# Patient Record
Sex: Female | Born: 1975 | Hispanic: Yes | Marital: Single | Smoking: Never smoker
Health system: Southern US, Community
[De-identification: ages and names within clinical notes are randomized; demographics above are authoritative.]

---

## 1999-12-14 ENCOUNTER — Encounter: Admission: RE | Admit: 1999-12-14 | Discharge: 1999-12-14 | Payer: Self-pay | Admitting: Family Medicine

## 1999-12-14 ENCOUNTER — Encounter: Payer: Self-pay | Admitting: Family Medicine

## 2015-02-27 MED FILL — LARIN FE 1-20 TABLET: 1-20 | 84 days supply | Qty: 84 | Fill #4

## 2015-05-28 MED FILL — LARIN FE 1-20 TABLET: 1-20 | 84 days supply | Qty: 84 | Fill #0

## 2015-07-30 DIAGNOSIS — Z1389 Encounter for screening for other disorder: Secondary | ICD-10-CM | POA: Diagnosis not present

## 2015-07-30 DIAGNOSIS — Z131 Encounter for screening for diabetes mellitus: Secondary | ICD-10-CM | POA: Diagnosis not present

## 2015-07-30 DIAGNOSIS — Z124 Encounter for screening for malignant neoplasm of cervix: Secondary | ICD-10-CM | POA: Diagnosis not present

## 2015-07-30 DIAGNOSIS — Z1329 Encounter for screening for other suspected endocrine disorder: Secondary | ICD-10-CM | POA: Diagnosis not present

## 2015-07-30 DIAGNOSIS — Z113 Encounter for screening for infections with a predominantly sexual mode of transmission: Secondary | ICD-10-CM | POA: Diagnosis not present

## 2015-07-30 DIAGNOSIS — Z6823 Body mass index (BMI) 23.0-23.9, adult: Secondary | ICD-10-CM | POA: Diagnosis not present

## 2015-07-30 DIAGNOSIS — Z01419 Encounter for gynecological examination (general) (routine) without abnormal findings: Secondary | ICD-10-CM | POA: Diagnosis not present

## 2015-07-30 DIAGNOSIS — Z13220 Encounter for screening for lipoid disorders: Secondary | ICD-10-CM | POA: Diagnosis not present

## 2015-09-02 MED FILL — NORETHIN-ESTRAD-FERR 1-0.02: 1-20 | 84 days supply | Qty: 84 | Fill #1

## 2015-12-01 MED FILL — NORETHIN-ESTRAD-FERR 1-0.02: 1-20 | 84 days supply | Qty: 84 | Fill #2

## 2016-02-24 MED FILL — NORETHIN-ESTRAD-FERR 1-0.02: 1-20 | 84 days supply | Qty: 84 | Fill #3

## 2016-05-18 MED FILL — NORETHIN-ESTRAD-FERR 1-0.02: 1-20 | 84 days supply | Qty: 84 | Fill #4

## 2016-08-12 MED FILL — NORETHIN-ESTRAD-FERR 1-0.02: 1-20 | 28 days supply | Qty: 28 | Fill #0

## 2016-09-06 DIAGNOSIS — Z130 Encounter for screening for diseases of the blood and blood-forming organs and certain disorders involving the immune mechanism: Secondary | ICD-10-CM | POA: Diagnosis not present

## 2016-09-06 DIAGNOSIS — Z01419 Encounter for gynecological examination (general) (routine) without abnormal findings: Secondary | ICD-10-CM | POA: Diagnosis not present

## 2016-09-06 DIAGNOSIS — Z6824 Body mass index (BMI) 24.0-24.9, adult: Secondary | ICD-10-CM | POA: Diagnosis not present

## 2016-09-06 DIAGNOSIS — Z1329 Encounter for screening for other suspected endocrine disorder: Secondary | ICD-10-CM | POA: Diagnosis not present

## 2016-09-06 DIAGNOSIS — Z13220 Encounter for screening for lipoid disorders: Secondary | ICD-10-CM | POA: Diagnosis not present

## 2016-09-06 DIAGNOSIS — Z124 Encounter for screening for malignant neoplasm of cervix: Secondary | ICD-10-CM | POA: Diagnosis not present

## 2016-09-06 MED FILL — NORETHIN-ESTRAD-FERR 1-0.02: 1-20 | 84 days supply | Qty: 84 | Fill #0

## 2016-12-22 MED FILL — NORETHIN-ESTRAD-FERR 1-0.02: 1-20 | 84 days supply | Qty: 84 | Fill #1

## 2017-03-22 MED FILL — LARIN FE 1-20 TABLET: 1-20 | 84 days supply | Qty: 84 | Fill #2

## 2017-06-15 MED FILL — LARIN FE 1-20 TABLET: 1-20 | 84 days supply | Qty: 84 | Fill #3

## 2017-09-15 MED FILL — NORETHIN-ESTRAD-FERR 1-0.02: 1-20 | 28 days supply | Qty: 28 | Fill #0

## 2017-10-07 MED FILL — NORETHIN-ESTRAD-FERR 1-0.02: 1-20 | 28 days supply | Qty: 28 | Fill #1

## 2017-11-03 DIAGNOSIS — Z13220 Encounter for screening for lipoid disorders: Secondary | ICD-10-CM | POA: Diagnosis not present

## 2017-11-03 DIAGNOSIS — Z01419 Encounter for gynecological examination (general) (routine) without abnormal findings: Secondary | ICD-10-CM | POA: Diagnosis not present

## 2017-11-03 DIAGNOSIS — Z124 Encounter for screening for malignant neoplasm of cervix: Secondary | ICD-10-CM | POA: Diagnosis not present

## 2017-11-03 DIAGNOSIS — Z1329 Encounter for screening for other suspected endocrine disorder: Secondary | ICD-10-CM | POA: Diagnosis not present

## 2017-11-03 DIAGNOSIS — Z1389 Encounter for screening for other disorder: Secondary | ICD-10-CM | POA: Diagnosis not present

## 2017-11-03 DIAGNOSIS — Z131 Encounter for screening for diabetes mellitus: Secondary | ICD-10-CM | POA: Diagnosis not present

## 2017-11-03 MED FILL — NORETHIN-ESTRAD-FERR 1-0.02: 1-20 | 84 days supply | Qty: 84 | Fill #0

## 2017-11-11 DIAGNOSIS — Z1231 Encounter for screening mammogram for malignant neoplasm of breast: Secondary | ICD-10-CM | POA: Diagnosis not present

## 2017-11-15 ENCOUNTER — Other Ambulatory Visit: Payer: Self-pay | Admitting: Obstetrics and Gynecology

## 2017-11-15 DIAGNOSIS — R928 Other abnormal and inconclusive findings on diagnostic imaging of breast: Secondary | ICD-10-CM

## 2017-11-18 ENCOUNTER — Ambulatory Visit
Admission: RE | Admit: 2017-11-18 | Discharge: 2017-11-18 | Disposition: A | Discharge status: Home / Self Care | Payer: 59 | Source: Ambulatory Visit | Attending: Obstetrics and Gynecology | Admitting: Obstetrics and Gynecology

## 2017-11-18 ENCOUNTER — Other Ambulatory Visit: Payer: Self-pay | Admitting: Obstetrics and Gynecology

## 2017-11-18 DIAGNOSIS — N6002 Solitary cyst of left breast: Secondary | ICD-10-CM | POA: Diagnosis not present

## 2017-11-18 DIAGNOSIS — R921 Mammographic calcification found on diagnostic imaging of breast: Secondary | ICD-10-CM | POA: Diagnosis not present

## 2017-11-18 DIAGNOSIS — N6320 Unspecified lump in the left breast, unspecified quadrant: Secondary | ICD-10-CM

## 2017-11-18 DIAGNOSIS — R928 Other abnormal and inconclusive findings on diagnostic imaging of breast: Secondary | ICD-10-CM

## 2018-01-30 MED FILL — BLISOVI FE 1/20 1-20 MG-MCG: 1-20 | 84 days supply | Qty: 84 | Fill #1

## 2018-04-26 MED FILL — BLISOVI FE 1/20 1-20 MG-MCG: 1-20 | 84 days supply | Qty: 84 | Fill #2

## 2018-05-26 ENCOUNTER — Other Ambulatory Visit: Payer: 59

## 2018-07-10 ENCOUNTER — Other Ambulatory Visit: Payer: 59

## 2018-07-19 MED FILL — BLISOVI FE 1/20 1-20 MG-MCG: 1-20 | 84 days supply | Qty: 84 | Fill #3

## 2018-07-28 ENCOUNTER — Ambulatory Visit
Admission: RE | Admit: 2018-07-28 | Discharge: 2018-07-28 | Disposition: A | Discharge status: Home / Self Care | Payer: 59 | Source: Ambulatory Visit | Attending: Obstetrics and Gynecology | Admitting: Obstetrics and Gynecology

## 2018-07-28 ENCOUNTER — Other Ambulatory Visit: Payer: Self-pay

## 2018-07-28 ENCOUNTER — Other Ambulatory Visit: Payer: Self-pay | Admitting: Obstetrics and Gynecology

## 2018-07-28 DIAGNOSIS — N6321 Unspecified lump in the left breast, upper outer quadrant: Secondary | ICD-10-CM | POA: Diagnosis not present

## 2018-07-28 DIAGNOSIS — N6320 Unspecified lump in the left breast, unspecified quadrant: Secondary | ICD-10-CM

## 2018-07-28 DIAGNOSIS — N6324 Unspecified lump in the left breast, lower inner quadrant: Secondary | ICD-10-CM | POA: Diagnosis not present

## 2018-07-28 DIAGNOSIS — R922 Inconclusive mammogram: Secondary | ICD-10-CM | POA: Diagnosis not present

## 2018-10-06 MED FILL — BLISOVI FE 1/20 1-20 MG-MCG: 1-20 | 84 days supply | Qty: 84 | Fill #4

## 2018-11-17 ENCOUNTER — Other Ambulatory Visit: Payer: 59

## 2018-11-29 ENCOUNTER — Other Ambulatory Visit: Payer: Self-pay

## 2018-11-29 DIAGNOSIS — Z20822 Contact with and (suspected) exposure to covid-19: Secondary | ICD-10-CM

## 2018-11-29 DIAGNOSIS — Z20828 Contact with and (suspected) exposure to other viral communicable diseases: Secondary | ICD-10-CM | POA: Diagnosis not present

## 2018-12-01 LAB — NOVEL CORONAVIRUS, NAA: SARS-CoV-2, NAA: NOT DETECTED

## 2018-12-26 DIAGNOSIS — Z13220 Encounter for screening for lipoid disorders: Secondary | ICD-10-CM | POA: Diagnosis not present

## 2018-12-26 DIAGNOSIS — Z202 Contact with and (suspected) exposure to infections with a predominantly sexual mode of transmission: Secondary | ICD-10-CM | POA: Diagnosis not present

## 2018-12-26 DIAGNOSIS — Z1389 Encounter for screening for other disorder: Secondary | ICD-10-CM | POA: Diagnosis not present

## 2018-12-26 DIAGNOSIS — Z01419 Encounter for gynecological examination (general) (routine) without abnormal findings: Secondary | ICD-10-CM | POA: Diagnosis not present

## 2018-12-26 DIAGNOSIS — Z1329 Encounter for screening for other suspected endocrine disorder: Secondary | ICD-10-CM | POA: Diagnosis not present

## 2018-12-26 DIAGNOSIS — Z113 Encounter for screening for infections with a predominantly sexual mode of transmission: Secondary | ICD-10-CM | POA: Diagnosis not present

## 2018-12-26 DIAGNOSIS — Z131 Encounter for screening for diabetes mellitus: Secondary | ICD-10-CM | POA: Diagnosis not present

## 2018-12-26 DIAGNOSIS — Z124 Encounter for screening for malignant neoplasm of cervix: Secondary | ICD-10-CM | POA: Diagnosis not present

## 2018-12-26 MED FILL — BLISOVI FE 1/20 1-20 MG-MCG: 1-20 | 84 days supply | Qty: 84 | Fill #0

## 2019-01-02 LAB — HM PAP SMEAR

## 2019-02-02 ENCOUNTER — Other Ambulatory Visit: Payer: 59

## 2019-03-06 ENCOUNTER — Other Ambulatory Visit: Payer: Self-pay

## 2019-03-06 ENCOUNTER — Ambulatory Visit
Admission: RE | Admit: 2019-03-06 | Discharge: 2019-03-06 | Disposition: A | Discharge status: Home / Self Care | Payer: 59 | Source: Ambulatory Visit | Attending: Obstetrics and Gynecology | Admitting: Obstetrics and Gynecology

## 2019-03-06 DIAGNOSIS — R922 Inconclusive mammogram: Secondary | ICD-10-CM | POA: Diagnosis not present

## 2019-03-06 DIAGNOSIS — N6320 Unspecified lump in the left breast, unspecified quadrant: Secondary | ICD-10-CM

## 2019-03-06 DIAGNOSIS — N6324 Unspecified lump in the left breast, lower inner quadrant: Secondary | ICD-10-CM | POA: Diagnosis not present

## 2019-03-06 DIAGNOSIS — N6321 Unspecified lump in the left breast, upper outer quadrant: Secondary | ICD-10-CM | POA: Diagnosis not present

## 2019-03-13 MED FILL — BLISOVI FE 1/20 1-20 MG-MCG: 1-20 | 84 days supply | Qty: 84 | Fill #1

## 2019-03-29 MED FILL — BLISOVI FE 1/20 1-20 MG-MCG: 1-20 | 84 days supply | Qty: 84 | Fill #1

## 2019-06-20 MED FILL — BLISOVI FE 1/20 1-20 MG-MCG: 1-20 | 84 days supply | Qty: 84 | Fill #2

## 2019-07-16 DIAGNOSIS — H0102A Squamous blepharitis right eye, upper and lower eyelids: Secondary | ICD-10-CM | POA: Diagnosis not present

## 2019-07-16 MED FILL — PREDNISOLONE AC 1% EYE DROP: 1 | 17 days supply | Qty: 5 | Fill #0

## 2019-07-18 DIAGNOSIS — M40292 Other kyphosis, cervical region: Secondary | ICD-10-CM | POA: Diagnosis not present

## 2019-07-18 DIAGNOSIS — M9901 Segmental and somatic dysfunction of cervical region: Secondary | ICD-10-CM | POA: Diagnosis not present

## 2019-07-18 DIAGNOSIS — M21751 Unequal limb length (acquired), right femur: Secondary | ICD-10-CM | POA: Diagnosis not present

## 2019-07-18 DIAGNOSIS — M9902 Segmental and somatic dysfunction of thoracic region: Secondary | ICD-10-CM | POA: Diagnosis not present

## 2019-07-19 DIAGNOSIS — M21751 Unequal limb length (acquired), right femur: Secondary | ICD-10-CM | POA: Diagnosis not present

## 2019-07-19 DIAGNOSIS — M9901 Segmental and somatic dysfunction of cervical region: Secondary | ICD-10-CM | POA: Diagnosis not present

## 2019-07-19 DIAGNOSIS — M40292 Other kyphosis, cervical region: Secondary | ICD-10-CM | POA: Diagnosis not present

## 2019-07-19 DIAGNOSIS — M9902 Segmental and somatic dysfunction of thoracic region: Secondary | ICD-10-CM | POA: Diagnosis not present

## 2019-07-24 DIAGNOSIS — M21751 Unequal limb length (acquired), right femur: Secondary | ICD-10-CM | POA: Diagnosis not present

## 2019-07-24 DIAGNOSIS — M40292 Other kyphosis, cervical region: Secondary | ICD-10-CM | POA: Diagnosis not present

## 2019-07-24 DIAGNOSIS — M9901 Segmental and somatic dysfunction of cervical region: Secondary | ICD-10-CM | POA: Diagnosis not present

## 2019-07-24 DIAGNOSIS — M9902 Segmental and somatic dysfunction of thoracic region: Secondary | ICD-10-CM | POA: Diagnosis not present

## 2019-07-27 DIAGNOSIS — M9902 Segmental and somatic dysfunction of thoracic region: Secondary | ICD-10-CM | POA: Diagnosis not present

## 2019-07-27 DIAGNOSIS — M40292 Other kyphosis, cervical region: Secondary | ICD-10-CM | POA: Diagnosis not present

## 2019-07-27 DIAGNOSIS — M9901 Segmental and somatic dysfunction of cervical region: Secondary | ICD-10-CM | POA: Diagnosis not present

## 2019-07-27 DIAGNOSIS — M21751 Unequal limb length (acquired), right femur: Secondary | ICD-10-CM | POA: Diagnosis not present

## 2019-07-31 DIAGNOSIS — M40292 Other kyphosis, cervical region: Secondary | ICD-10-CM | POA: Diagnosis not present

## 2019-07-31 DIAGNOSIS — M21751 Unequal limb length (acquired), right femur: Secondary | ICD-10-CM | POA: Diagnosis not present

## 2019-07-31 DIAGNOSIS — M9901 Segmental and somatic dysfunction of cervical region: Secondary | ICD-10-CM | POA: Diagnosis not present

## 2019-07-31 DIAGNOSIS — M9902 Segmental and somatic dysfunction of thoracic region: Secondary | ICD-10-CM | POA: Diagnosis not present

## 2019-08-02 DIAGNOSIS — M9902 Segmental and somatic dysfunction of thoracic region: Secondary | ICD-10-CM | POA: Diagnosis not present

## 2019-08-02 DIAGNOSIS — M9901 Segmental and somatic dysfunction of cervical region: Secondary | ICD-10-CM | POA: Diagnosis not present

## 2019-08-02 DIAGNOSIS — M40292 Other kyphosis, cervical region: Secondary | ICD-10-CM | POA: Diagnosis not present

## 2019-08-02 DIAGNOSIS — M21751 Unequal limb length (acquired), right femur: Secondary | ICD-10-CM | POA: Diagnosis not present

## 2019-08-07 DIAGNOSIS — M40292 Other kyphosis, cervical region: Secondary | ICD-10-CM | POA: Diagnosis not present

## 2019-08-07 DIAGNOSIS — M9902 Segmental and somatic dysfunction of thoracic region: Secondary | ICD-10-CM | POA: Diagnosis not present

## 2019-08-07 DIAGNOSIS — M9901 Segmental and somatic dysfunction of cervical region: Secondary | ICD-10-CM | POA: Diagnosis not present

## 2019-08-07 DIAGNOSIS — M21751 Unequal limb length (acquired), right femur: Secondary | ICD-10-CM | POA: Diagnosis not present

## 2019-08-09 DIAGNOSIS — Z113 Encounter for screening for infections with a predominantly sexual mode of transmission: Secondary | ICD-10-CM | POA: Diagnosis not present

## 2019-08-09 DIAGNOSIS — B373 Candidiasis of vulva and vagina: Secondary | ICD-10-CM | POA: Diagnosis not present

## 2019-08-09 DIAGNOSIS — L292 Pruritus vulvae: Secondary | ICD-10-CM | POA: Diagnosis not present

## 2019-08-09 MED FILL — NYSTATIN-TRIAMCINOLONE OINT: 100000-0.1 | 7 days supply | Qty: 15 | Fill #0

## 2019-08-09 MED FILL — FLUCONAZOLE 150 MG TABS: 150 | 3 days supply | Qty: 2 | Fill #0

## 2019-08-10 DIAGNOSIS — M40292 Other kyphosis, cervical region: Secondary | ICD-10-CM | POA: Diagnosis not present

## 2019-08-10 DIAGNOSIS — M21751 Unequal limb length (acquired), right femur: Secondary | ICD-10-CM | POA: Diagnosis not present

## 2019-08-10 DIAGNOSIS — M9902 Segmental and somatic dysfunction of thoracic region: Secondary | ICD-10-CM | POA: Diagnosis not present

## 2019-08-10 DIAGNOSIS — M9901 Segmental and somatic dysfunction of cervical region: Secondary | ICD-10-CM | POA: Diagnosis not present

## 2019-08-14 DIAGNOSIS — M21751 Unequal limb length (acquired), right femur: Secondary | ICD-10-CM | POA: Diagnosis not present

## 2019-08-14 DIAGNOSIS — M9902 Segmental and somatic dysfunction of thoracic region: Secondary | ICD-10-CM | POA: Diagnosis not present

## 2019-08-14 DIAGNOSIS — M9901 Segmental and somatic dysfunction of cervical region: Secondary | ICD-10-CM | POA: Diagnosis not present

## 2019-08-14 DIAGNOSIS — M40292 Other kyphosis, cervical region: Secondary | ICD-10-CM | POA: Diagnosis not present

## 2019-08-30 ENCOUNTER — Encounter: Payer: Self-pay | Admitting: Pediatrics

## 2019-08-31 ENCOUNTER — Other Ambulatory Visit: Payer: Self-pay | Admitting: Pediatrics

## 2019-08-31 MED ORDER — CIPROFLOXACIN-DEXAMETHASONE 0.3-0.1 % OT SUSP: 4.0000 [drp] | Freq: Two times a day (BID) | OTIC | 0 refills | Status: AC

## 2019-08-31 MED FILL — BLISOVI FE 1/20 1-20 MG-MCG: 1-20 | 84 days supply | Qty: 84 | Fill #3

## 2019-08-31 MED FILL — CIPROFLOXACIN-DEXAMETHASONE: 0.3-0.1 | 7 days supply | Qty: 8 | Fill #0

## 2019-10-09 DIAGNOSIS — Z20828 Contact with and (suspected) exposure to other viral communicable diseases: Secondary | ICD-10-CM | POA: Diagnosis not present

## 2019-11-29 MED FILL — BLISOVI FE 1/20 1-20 MG-MCG: 1-20 | 84 days supply | Qty: 84 | Fill #4

## 2020-01-11 DIAGNOSIS — Z130 Encounter for screening for diseases of the blood and blood-forming organs and certain disorders involving the immune mechanism: Secondary | ICD-10-CM | POA: Diagnosis not present

## 2020-01-11 DIAGNOSIS — Z113 Encounter for screening for infections with a predominantly sexual mode of transmission: Secondary | ICD-10-CM | POA: Diagnosis not present

## 2020-01-11 DIAGNOSIS — Z3202 Encounter for pregnancy test, result negative: Secondary | ICD-10-CM | POA: Diagnosis not present

## 2020-01-11 DIAGNOSIS — Z01419 Encounter for gynecological examination (general) (routine) without abnormal findings: Secondary | ICD-10-CM | POA: Diagnosis not present

## 2020-01-11 DIAGNOSIS — Z13220 Encounter for screening for lipoid disorders: Secondary | ICD-10-CM | POA: Diagnosis not present

## 2020-01-11 DIAGNOSIS — Z1329 Encounter for screening for other suspected endocrine disorder: Secondary | ICD-10-CM | POA: Diagnosis not present

## 2020-01-11 LAB — BASIC METABOLIC PANEL
BUN: 14 (ref 4–21)
CO2: 24 — AB (ref 13–22)
Chloride: 98 — AB (ref 99–108)
Creatinine: 1 (ref 0.5–1.1)
Glucose: 84
Potassium: 3.8 (ref 3.4–5.3)
Sodium: 140 (ref 137–147)

## 2020-01-11 LAB — HM HEPATITIS C SCREENING LAB: HM Hepatitis Screen: NEGATIVE

## 2020-01-11 LAB — COMPREHENSIVE METABOLIC PANEL
Albumin: 5 (ref 3.5–5.0)
Calcium: 9.8 (ref 8.7–10.7)
GFR calc Af Amer: 85
GFR calc non Af Amer: 74
Globulin: 2.4

## 2020-01-11 LAB — RESULTS CONSOLE HPV: CHL HPV: NEGATIVE

## 2020-01-11 LAB — CBC AND DIFFERENTIAL
HCT: 38 (ref 36–46)
Hemoglobin: 13.4 (ref 12.0–16.0)
Platelets: 329 (ref 150–399)
WBC: 6.1

## 2020-01-11 LAB — LIPID PANEL
Cholesterol: 191 (ref 0–200)
HDL: 88 — AB (ref 35–70)
LDL Cholesterol: 89
LDl/HDL Ratio: 14
Triglycerides: 76 (ref 40–160)

## 2020-01-11 LAB — TSH: TSH: 2.22 (ref 0.41–5.90)

## 2020-01-11 LAB — CBC: RBC: 4.14 (ref 3.87–5.11)

## 2020-01-11 LAB — HEMOGLOBIN A1C: Hemoglobin A1C: 5.1

## 2020-01-11 LAB — HM HIV SCREENING LAB: HM HIV Screening: NEGATIVE

## 2020-01-11 LAB — HEPATIC FUNCTION PANEL
ALT: 27 (ref 7–35)
AST: 19 (ref 13–35)
Bilirubin, Total: 0.4

## 2020-01-11 LAB — OB RESULTS CONSOLE GC/CHLAMYDIA: Chlamydia: NEGATIVE

## 2020-01-11 LAB — HIV ANTIBODY (ROUTINE TESTING W REFLEX): HIV 1&2 Ab, 4th Generation: NEGATIVE

## 2020-01-11 LAB — HEPATITIS B SURFACE ANTIGEN: Hepatitis B Surface Ag: NEGATIVE

## 2020-01-11 LAB — MICROALBUMIN, URINE: Microalb, Ur: NEGATIVE

## 2020-01-12 LAB — VITAMIN D 25 HYDROXY (VIT D DEFICIENCY, FRACTURES): Vit D, 25-Hydroxy: 137

## 2020-02-01 DIAGNOSIS — Z20822 Contact with and (suspected) exposure to covid-19: Secondary | ICD-10-CM | POA: Diagnosis not present

## 2020-02-27 ENCOUNTER — Other Ambulatory Visit: Payer: 59

## 2020-02-27 DIAGNOSIS — Z20822 Contact with and (suspected) exposure to covid-19: Secondary | ICD-10-CM

## 2020-02-27 MED FILL — BLISOVI FE 1/20 1-20 MG-MCG: 1-20 | 84 days supply | Qty: 84 | Fill #0

## 2020-03-01 LAB — NOVEL CORONAVIRUS, NAA: SARS-CoV-2, NAA: NOT DETECTED

## 2020-04-29 ENCOUNTER — Other Ambulatory Visit (HOSPITAL_BASED_OUTPATIENT_CLINIC_OR_DEPARTMENT_OTHER): Payer: Self-pay

## 2020-04-29 MED ORDER — NORETHIN ACE-ETH ESTRAD-FE 1-20 MG-MCG PO TABS
1.0000 | ORAL_TABLET | Freq: Every day | ORAL | 3 refills | Status: DC
  Filled 2020-05-26: qty 84, 84d supply, fill #0
  Filled 2020-08-18: qty 84, 84d supply, fill #1
  Filled 2020-11-11: qty 84, 84d supply, fill #2

## 2020-05-26 ENCOUNTER — Other Ambulatory Visit (HOSPITAL_BASED_OUTPATIENT_CLINIC_OR_DEPARTMENT_OTHER): Payer: Self-pay

## 2020-06-10 ENCOUNTER — Other Ambulatory Visit: Payer: Self-pay | Admitting: Obstetrics and Gynecology

## 2020-06-10 DIAGNOSIS — R928 Other abnormal and inconclusive findings on diagnostic imaging of breast: Secondary | ICD-10-CM

## 2020-06-10 DIAGNOSIS — Z1231 Encounter for screening mammogram for malignant neoplasm of breast: Secondary | ICD-10-CM

## 2020-07-17 ENCOUNTER — Other Ambulatory Visit: Payer: Self-pay | Admitting: Obstetrics and Gynecology

## 2020-07-17 DIAGNOSIS — N6320 Unspecified lump in the left breast, unspecified quadrant: Secondary | ICD-10-CM

## 2020-07-17 DIAGNOSIS — Z1231 Encounter for screening mammogram for malignant neoplasm of breast: Secondary | ICD-10-CM

## 2020-07-18 ENCOUNTER — Other Ambulatory Visit: Payer: 59

## 2020-07-24 LAB — HM MAMMOGRAPHY

## 2020-08-12 ENCOUNTER — Other Ambulatory Visit: Payer: Self-pay | Admitting: Obstetrics and Gynecology

## 2020-08-12 DIAGNOSIS — N6320 Unspecified lump in the left breast, unspecified quadrant: Secondary | ICD-10-CM

## 2020-08-12 DIAGNOSIS — N63 Unspecified lump in unspecified breast: Secondary | ICD-10-CM

## 2020-08-14 ENCOUNTER — Encounter (HOSPITAL_BASED_OUTPATIENT_CLINIC_OR_DEPARTMENT_OTHER): Payer: Self-pay | Admitting: Nurse Practitioner

## 2020-08-14 ENCOUNTER — Ambulatory Visit (HOSPITAL_BASED_OUTPATIENT_CLINIC_OR_DEPARTMENT_OTHER): Payer: 59 | Admitting: Nurse Practitioner

## 2020-08-14 ENCOUNTER — Other Ambulatory Visit: Payer: Self-pay

## 2020-08-14 VITALS — BP 117/79 | HR 75 | Ht 64.0 in | Wt 146.2 lb

## 2020-08-14 DIAGNOSIS — Z7689 Persons encountering health services in other specified circumstances: Secondary | ICD-10-CM | POA: Insufficient documentation

## 2020-08-14 DIAGNOSIS — Z0000 Encounter for general adult medical examination without abnormal findings: Secondary | ICD-10-CM | POA: Insufficient documentation

## 2020-08-14 NOTE — Patient Instructions (Addendum)
Recommendations from today's visit: Let us know if you need anything.  We will see you in November unless you need [place] sooner!  Information on diet, exercise, and health maintenance recommendations are listed below. This is information to help you be sure you are on track for optimal health and monitoring.   Please look over this and let us know if you have any questions or if you have completed any of the health maintenance outside of [place] so that we can be sure your records are up to date.  ___________________________________________________________  Thank you for choosing [place] at [facility] for your Primary Care needs. I am excited for the opportunity to partner with you to meet your health care goals. It was a pleasure meeting you today!  I am an Adult-Geriatric Nurse Practitioner with a background in caring for patients for more than 20 years. I received my Paediatric nurse in Nursing and my Doctor of Nursing Practice degrees at [employer]. I received additional fellowship training in primary care and sports medicine after receiving my doctorate degree. I provide primary care and sports medicine services to patients age 47 and older within this office. I am also a provider with the [facility] and the director of the APP Fellowship with [facility].  I am a [place] native, but have called the [place] area home for nearly 20 years and am proud to be a member of this community.   I am passionate about providing the best service to you through preventive medicine and supportive care. I consider you a part of the medical team and value your input. I work diligently to ensure that you are heard and your needs are met in a safe and effective manner. I want you to feel comfortable with me as your provider and want you to know that your health concerns are important to me.   For your information, our office hours are  Monday- Friday 8:00 AM - 5:00 PM At this time I am not in the office on Wednesdays.  If you have questions or concerns, please call our office at [phone] or send [place] a MyChart message and we will respond as quickly as possible.   For all urgent or time sensitive needs we ask that you please call the office to avoid delays. MyChart is not constantly monitored and replies may take up to 72 business hours.  MyChart Policy: MyChart allows for you to see your visit notes, after visit summary, provider recommendations, lab and tests results, make an appointment, request refills, and contact your provider or the office for non-urgent questions or concerns.  Providers are seeing patients during normal business hours and do not have built in time to review MyChart messages. We ask that you allow a minimum of 72 business hours for MyChart message responses.  Complex MyChart concerns may require a visit. Your provider may request you schedule a virtual or in person visit to ensure we are providing the best care possible. MyChart messages sent after 4:00 PM on Friday will not be received by the provider until Monday morning.    Lab and Test Results: You will receive your lab and test results on MyChart as soon as they are completed and results have been sent by the lab or testing facility. Due to this service, you will receive your results BEFORE your provider.  Please allow a minimum of 72 business hours for your provider to receive and  review lab and test results and contact you about.   Most lab and test result comments from the provider will be sent through [employer]. Your provider may recommend changes to the plan of care, follow-up visits, repeat testing, ask questions, or request an office visit to discuss these results. You may reply directly to this message or call the office at [phone] to provide information for the provider or set up an appointment. In some instances, you will be called with  test results and recommendations. Please let us know if this is preferred and we will make note of this in your chart to provide this for you.    If you have not heard a response to your lab or test results in 72 business hours, please call the office to let us know.   After Hours: For all non-emergency after hours needs, please call the office at [phone] and select the option to reach the on-call provider service. On-call services are shared between multiple [place] offices and therefore it will not be possible to speak directly with your provider. On-call providers may provide medical advice and recommendations, but are unable to provide refills for maintenance medications.  For all emergency or urgent medical needs after normal business hours, we recommend that you seek care at the closest Urgent Care or Emergency Department to ensure appropriate treatment in a timely manner.  [facility] [place] has a 24 hour emergency room located on the ground floor for your convenience.    Please do not hesitate to reach out to [place] with concerns.   Thank you, again, for choosing me as your health care partner. I appreciate your trust and look forward to learning more about you.   Worthy Keeler, DNP, AGNP-c ___________________________________________________________  Health Maintenance Recommendations Screening Testing Mammogram Every 1 -2 years based on history and risk factors Starting at age 79 Pap Smear Ages 21-39 every 3 years Ages 16-65 every 5 years with HPV testing More frequent testing may be required based on results and history Colon Cancer Screening Every 1-10 years based on test performed, risk factors, and history Starting at age 84 Bone Density Screening Every 2-10 years based on history Starting at age 68 for women Recommendations for men differ based on medication usage, history, and risk factors AAA Screening One time ultrasound Men 51-17 years old who  have every smoked Lung Cancer Screening Low Dose Lung CT every 12 months Age 91-80 years with a 30 pack-year smoking history who still smoke or who have quit within the last 15 years  Screening Labs Routine  Labs: Complete Blood Count (CBC), Complete Metabolic Panel (CMP), Cholesterol (Lipid Panel) Every 6-12 months based on history and medications May be recommended more frequently based on current conditions or previous results Hemoglobin A1c Lab Every 3-12 months based on history and previous results Starting at age 28 or earlier with diagnosis of diabetes, high cholesterol, BMI >26, and/or risk factors Frequent monitoring for patients with diabetes to ensure blood sugar control Thyroid Panel (TSH w/ T3 & T4) Every 6 months based on history, symptoms, and risk factors May be repeated more often if on medication HIV One time testing for all patients 36 and older May be repeated more frequently for patients with increased risk factors or exposure Hepatitis C One time testing for all patients 87 and older May be repeated more frequently for patients with increased risk factors or exposure Gonorrhea, Chlamydia Every 12 months for all sexually active persons 13-24 years  Additional monitoring may be recommended for those who are considered high risk or who have symptoms PSA Men 71-9 years old with risk factors Additional screening may be recommended from age 76-69 based on risk factors, symptoms, and history  Vaccine Recommendations Tetanus Booster All adults every 10 years Flu Vaccine All patients 6 months and older every year COVID Vaccine All patients 12 years and older Initial dosing with booster May recommend additional booster based on age and health history HPV Vaccine 2 doses all patients age 66-26 Dosing may be considered for patients over 26 Shingles Vaccine (Shingrix) 2 doses all adults 38 years and older Pneumonia (Pneumovax 23) All adults 76 years and older May  recommend earlier dosing based on health history Pneumonia (Prevnar 47) All adults 29 years and older Dosed 1 year after Pneumovax 23  Additional Screening, Testing, and Vaccinations may be recommended on an individualized basis based on family history, health history, risk factors, and/or exposure.  __________________________________________________________  Diet Recommendations for All Patients  I recommend that all patients maintain a diet low in saturated fats, carbohydrates, and cholesterol. While this can be challenging at first, it is not impossible and small changes can make big differences.  Things to try: Decreasing the amount of soda, sweet tea, and/or juice to one or less per day and replace with water While water is always the first choice, if you do not like water you may consider adding a water additive without sugar to improve the taste other sugar free drinks Replace potatoes with a brightly colored vegetable at dinner Use healthy oils, such as canola oil or olive oil, instead of butter or hard margarine Limit your bread intake to two pieces or less a day Replace regular pasta with low carb pasta options Bake, broil, or grill foods instead of frying Monitor portion sizes  Eat smaller, more frequent meals throughout the day instead of large meals  An important thing to remember is, if you love foods that are not great for your health, you don't have to give them up completely. Instead, allow these foods to be a reward when you have done well. Allowing yourself to still have special treats every once in a while is a nice way to tell yourself thank you for working hard to keep yourself healthy.   Also remember that every day is a new day. If you have a bad day and "fall off the wagon", you can still climb right back up and keep moving along on your journey!  We have resources available to help you!  Some websites that may be helpful  include: www.http://carter.biz/  Www.VeryWellFit.com _____________________________________________________________  Activity Recommendations for All Patients  I recommend that all adults get at least 20 minutes of moderate physical activity that elevates your heart rate at least 5 days out of the week.  Some examples include: Walking or jogging at a pace that allows you to carry on a conversation Cycling (stationary bike or outdoors) Water aerobics Yoga Weight lifting Dancing If physical limitations prevent you from putting stress on your joints, exercise in a pool or seated in a chair are excellent options.  Do determine your MAXIMUM heart rate for activity: YOUR AGE - 220 = MAX HeartRate   Remember! Do not push yourself too hard.  Start slowly and build up your pace, speed, weight, time in exercise, etc.  Allow your body to rest between exercise and get good sleep. You will need more water than normal when you are exerting yourself.  Do not wait until you are thirsty to drink. Drink with a purpose of getting in at least 8, 8 ounce glasses of water a day plus more depending on how much you exercise and sweat.    If you begin to develop dizziness, chest pain, abdominal pain, jaw pain, shortness of breath, headache, vision changes, lightheadedness, or other concerning symptoms, stop the activity and allow your body to rest. If your symptoms are severe, seek emergency evaluation immediately. If your symptoms are concerning, but not severe, please let us know so that we can recommend further evaluation.   ________________________________________________________________

## 2020-08-14 NOTE — Assessment & Plan Note (Signed)
Review of current and past medical history, social history, medication, and family history.  Review of care gaps and health maintenance recommendations.  Records from recent providers to be requested if not available in Chart Review or Care Everywhere.  Recommendations for health maintenance, diet, and exercise provided.  Discussed Tdap recommendation- pt will think about this Discussed HPV vaccine completion of series, she will schedule nurse visit to have this done- 2nd vaccine and 3rd vaccine 6 months apart.

## 2020-08-14 NOTE — Progress Notes (Signed)
Crystal Clamp, DNP, AGNP-c Primary Care Services ______________________________________________________________________  HPI Crystal Lawson. is a 45 y.o. female presenting to [facility] [place] [facility] today to establish care.   Patient Care Team: Tari Lecount, Sung Amabile, NP as PCP - General (Nurse Practitioner)  Health Maintenance   Topic Date Due Plan   HIV Screening  Never done Recommend with next labs   Hepatitis C Screening: USPSTF Recommendation to screen - Ages 18-79 yo.  Never done Recommend with next labs   Tetanus Vaccine  2011 Patient will think about   Pap Smear  12/2019 Dr. Waynard Reeds   Flu Shot  09/22/2020    COVID-19 Vaccine  Completed    Pneumococcal Vaccination  Aged Out    HPV Vaccine  Aged Out      Concerns today: None   There are no problems to display for this patient.   PHQ9 Today: Depression screen PHQ 2/9 08/14/2020  Decreased Interest 1  Down, Depressed, Hopeless 0  PHQ - 2 Score 1  Altered sleeping 0  Tired, decreased energy 1  Change in appetite 0  Feeling bad or failure about yourself  0  Trouble concentrating 0  Moving slowly or fidgety/restless 0  Suicidal thoughts 0  PHQ-9 Score 2  Difficult doing work/chores Not difficult at all   GAD7 Today: GAD 7 : Generalized Anxiety Score 08/14/2020  Nervous, Anxious, on Edge 0  Control/stop worrying 0  Worry too much - different things 0  Trouble relaxing 0  Restless 0  Easily annoyed or irritable 0  Afraid - awful might happen 0  Total GAD 7 Score 0   ______________________________________________________________________ PMH History reviewed. No pertinent past medical history.  ROS All review of systems negative except what is listed in the HPI  PHYSICAL EXAM Physical Exam Vitals and nursing note reviewed.  Constitutional:      Appearance: Normal appearance. She is normal weight.  HENT:     Head: Normocephalic and atraumatic.  Eyes:      Extraocular Movements: Extraocular movements intact.     Conjunctiva/sclera: Conjunctivae normal.     Pupils: Pupils are equal, round, and reactive to light.  Cardiovascular:     Rate and Rhythm: Normal rate and regular rhythm.     Pulses: Normal pulses.     Heart sounds: Normal heart sounds.  Pulmonary:     Effort: Pulmonary effort is normal.     Breath sounds: Normal breath sounds.  Abdominal:     General: Abdomen is flat. Bowel sounds are normal.     Palpations: Abdomen is soft.  Musculoskeletal:        General: Normal range of motion.     Cervical back: Normal range of motion and neck supple.     Right lower leg: No edema.     Left lower leg: No edema.  Skin:    General: Skin is warm and dry.     Capillary Refill: Capillary refill takes less than 2 seconds.  Neurological:     General: No focal deficit present.     Mental Status: She is alert and oriented to person, place, and time.  Psychiatric:        Mood and Affect: Mood normal.        Behavior: Behavior normal.        Thought Content: Thought content normal.        Judgment: Judgment normal.   ______________________________________________________________________ ASSESSMENT AND PLAN Problem List Items Addressed This Visit  None   Education provided today during visit and on AVS for patient to review at home.  Diet and Exercise recommendations provided.  Current diagnoses and recommendations discussed. HM recommendations reviewed with recommendations.    Outpatient Encounter Medications as of 08/14/2020  Medication Sig   magnesium 30 MG tablet Take 30 mg by mouth 2 (two) times daily.   Multiple Vitamin (MULTIVITAMIN) tablet Take 1 tablet by mouth daily.   norethindrone-ethinyl estradiol (BLISOVI FE 1/20) 1-20 MG-MCG tablet Take 1 tablet by mouth daily.   vitamin C (ASCORBIC ACID) 500 MG tablet Take 500 mg by mouth daily.   No facility-administered encounter medications on file as of 08/14/2020.    No  follow-ups on file.  Time: 30 minutes, >50% spent counseling, care coordination, chart review, and documentation.   Tollie Eth, DNP, AGNP-c

## 2020-08-15 ENCOUNTER — Ambulatory Visit
Admission: RE | Admit: 2020-08-15 | Discharge: 2020-08-15 | Disposition: A | Discharge status: Home / Self Care | Payer: 59 | Source: Ambulatory Visit | Attending: Obstetrics and Gynecology | Admitting: Obstetrics and Gynecology

## 2020-08-15 DIAGNOSIS — N6321 Unspecified lump in the left breast, upper outer quadrant: Secondary | ICD-10-CM | POA: Diagnosis not present

## 2020-08-15 DIAGNOSIS — N63 Unspecified lump in unspecified breast: Secondary | ICD-10-CM

## 2020-08-15 DIAGNOSIS — N6320 Unspecified lump in the left breast, unspecified quadrant: Secondary | ICD-10-CM

## 2020-08-15 DIAGNOSIS — R922 Inconclusive mammogram: Secondary | ICD-10-CM | POA: Diagnosis not present

## 2020-08-15 DIAGNOSIS — N6323 Unspecified lump in the left breast, lower outer quadrant: Secondary | ICD-10-CM | POA: Diagnosis not present

## 2020-08-18 ENCOUNTER — Other Ambulatory Visit (HOSPITAL_BASED_OUTPATIENT_CLINIC_OR_DEPARTMENT_OTHER): Payer: Self-pay

## 2020-11-11 ENCOUNTER — Other Ambulatory Visit (HOSPITAL_BASED_OUTPATIENT_CLINIC_OR_DEPARTMENT_OTHER): Payer: Self-pay

## 2020-11-19 ENCOUNTER — Encounter (HOSPITAL_BASED_OUTPATIENT_CLINIC_OR_DEPARTMENT_OTHER): Payer: Self-pay | Admitting: Nurse Practitioner

## 2020-11-26 ENCOUNTER — Other Ambulatory Visit: Payer: Self-pay

## 2020-11-26 ENCOUNTER — Ambulatory Visit (INDEPENDENT_AMBULATORY_CARE_PROVIDER_SITE_OTHER): Payer: 59 | Admitting: Family Medicine

## 2020-11-26 DIAGNOSIS — Z23 Encounter for immunization: Secondary | ICD-10-CM | POA: Diagnosis not present

## 2020-11-26 NOTE — Progress Notes (Signed)
Patient presented for the flu shot.  She tolerated it well.

## 2020-12-08 ENCOUNTER — Ambulatory Visit: Payer: 59 | Attending: Internal Medicine

## 2020-12-08 ENCOUNTER — Other Ambulatory Visit (HOSPITAL_BASED_OUTPATIENT_CLINIC_OR_DEPARTMENT_OTHER): Payer: Self-pay

## 2020-12-08 ENCOUNTER — Other Ambulatory Visit: Payer: Self-pay

## 2020-12-08 DIAGNOSIS — Z23 Encounter for immunization: Secondary | ICD-10-CM

## 2020-12-08 MED ORDER — MODERNA COVID-19 BIVAL BOOSTER 50 MCG/0.5ML IM SUSP
INTRAMUSCULAR | 0 refills | Status: DC
  Filled 2020-12-08: qty 0.5, 1d supply, fill #0

## 2020-12-08 NOTE — Progress Notes (Signed)
   [id number]  Name:  Crystal Lawson X.    MRN: [id number] DOB: 25-May-1975  12/08/2020  [name] was observed post Covid-19 immunization for 15 minutes without incident. She was provided with Vaccine Information Sheet and instruction to access the V-Safe system.   [name] was instructed to call 911 with any severe reactions post vaccine: Difficulty breathing  Swelling of face and throat  A fast heartbeat  A bad rash all over body  Dizziness and weakness

## 2021-01-14 ENCOUNTER — Encounter (HOSPITAL_BASED_OUTPATIENT_CLINIC_OR_DEPARTMENT_OTHER): Payer: Self-pay | Admitting: Pharmacist

## 2021-01-14 ENCOUNTER — Other Ambulatory Visit (HOSPITAL_BASED_OUTPATIENT_CLINIC_OR_DEPARTMENT_OTHER): Payer: Self-pay | Admitting: Nurse Practitioner

## 2021-01-14 ENCOUNTER — Other Ambulatory Visit (HOSPITAL_BASED_OUTPATIENT_CLINIC_OR_DEPARTMENT_OTHER): Payer: Self-pay

## 2021-01-19 ENCOUNTER — Other Ambulatory Visit (HOSPITAL_BASED_OUTPATIENT_CLINIC_OR_DEPARTMENT_OTHER): Payer: Self-pay | Admitting: Nurse Practitioner

## 2021-01-19 ENCOUNTER — Other Ambulatory Visit (HOSPITAL_BASED_OUTPATIENT_CLINIC_OR_DEPARTMENT_OTHER): Payer: Self-pay

## 2021-01-19 MED ORDER — NORETHIN ACE-ETH ESTRAD-FE 1-20 MG-MCG PO TABS
1.0000 | ORAL_TABLET | Freq: Every day | ORAL | 3 refills | Status: DC
  Filled 2021-01-19: qty 84, 84d supply, fill #0
  Filled 2021-05-04: qty 84, 84d supply, fill #1
  Filled 2021-07-28: qty 84, 84d supply, fill #2
  Filled 2021-10-12: qty 84, 84d supply, fill #3

## 2021-01-22 ENCOUNTER — Encounter (HOSPITAL_BASED_OUTPATIENT_CLINIC_OR_DEPARTMENT_OTHER): Payer: Self-pay | Admitting: Nurse Practitioner

## 2021-01-22 ENCOUNTER — Other Ambulatory Visit: Payer: Self-pay

## 2021-01-22 ENCOUNTER — Ambulatory Visit (INDEPENDENT_AMBULATORY_CARE_PROVIDER_SITE_OTHER): Payer: 59 | Admitting: Nurse Practitioner

## 2021-01-22 VITALS — BP 130/82 | HR 98 | Ht 64.0 in | Wt 150.2 lb

## 2021-01-22 DIAGNOSIS — Z13228 Encounter for screening for other metabolic disorders: Secondary | ICD-10-CM

## 2021-01-22 DIAGNOSIS — Z1321 Encounter for screening for nutritional disorder: Secondary | ICD-10-CM | POA: Diagnosis not present

## 2021-01-22 DIAGNOSIS — Z0000 Encounter for general adult medical examination without abnormal findings: Secondary | ICD-10-CM | POA: Diagnosis not present

## 2021-01-22 DIAGNOSIS — Z833 Family history of diabetes mellitus: Secondary | ICD-10-CM | POA: Diagnosis not present

## 2021-01-22 DIAGNOSIS — Z130 Encounter for screening for diseases of the blood and blood-forming organs and certain disorders involving the immune mechanism: Secondary | ICD-10-CM

## 2021-01-22 DIAGNOSIS — N926 Irregular menstruation, unspecified: Secondary | ICD-10-CM | POA: Diagnosis not present

## 2021-01-22 DIAGNOSIS — Z1329 Encounter for screening for other suspected endocrine disorder: Secondary | ICD-10-CM | POA: Diagnosis not present

## 2021-01-22 NOTE — Assessment & Plan Note (Signed)
CPE today with no abnormal findings Will obtain labs today for further evaluation and make recommendations on any abnormalities.  Pap due in 2025 Mammogram due next summer Colon cancer screening next year Continue healthy diet and daily activity.  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  Monitor for menstrual changes and let me know if these occur.  F/U in 1 year or sooner if needed.

## 2021-01-22 NOTE — Progress Notes (Signed)
BP 130/82   Pulse 98   Ht 5\' 4"  (1.626 m)   Wt 150 lb 3.2 oz (68.1 kg)   SpO2 99%   BMI 25.78 kg/m    Subjective:    Patient ID: ., female    DOB: Jul 17, 1975, 45 y.o.   MRN: [id number]  HPI: Crystal Lawson is a 45 y.o. female presenting on 01/22/2021 for comprehensive medical examination.   Current medical concerns include:none  Past Medical History:  History reviewed. No pertinent past medical history. Medications:  Current Outpatient Medications on File Prior to Visit  Medication Sig   COVID-19 mRNA bivalent vaccine, Moderna, (MODERNA COVID-19 BIVAL BOOSTER) 50 MCG/0.5ML injection Inject into the muscle.   magnesium 30 MG tablet Take 30 mg by mouth 2 (two) times daily.   Multiple Vitamin (MULTIVITAMIN) tablet Take 1 tablet by mouth daily.   norethindrone-ethinyl estradiol-FE (BLISOVI FE 1/20) 1-20 MG-MCG tablet Take 1 tablet by mouth daily.   vitamin C (ASCORBIC ACID) 500 MG tablet Take 500 mg by mouth daily.   No current facility-administered medications on file prior to visit.    She currently lives with: Alone Interim Problems from her last visit: no   She reports regular vision exams q1-5y: yes She reports regular dental exams q 23m: yes Her diet consists of:  Overall healthy choices.  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 She will monitor.  Current menopausal symptoms: no   She is not currently sexually active  She denies concerns today about STI  She denies concerns about skin changes today:  She denies concerns about bowel changes today:  She denies concerns about bladder changes today:   Depression Screen done today and  results listed below:  Depression screen [facility] 2/9 01/22/2021 08/14/2020  Decreased Interest 0 1  Down, Depressed, Hopeless 0 0  PHQ - 2 Score 0 1  Altered sleeping 0 0  Tired, decreased energy 0 1  Change in appetite 0 0  Feeling bad or failure about yourself  0 0  Trouble concentrating 0 0  Moving slowly or fidgety/restless 0 0  Suicidal thoughts 0 0  PHQ-9 Score 0 2  Difficult doing work/chores Not difficult at all Not difficult at all   Anxiety Screening done GAD 7 : Generalized Anxiety Score 01/22/2021 08/14/2020  Nervous, Anxious, on Edge 0 0  Control/stop worrying 0 0  Worry too much - different things 0 0  Trouble relaxing 0 0  Restless 0 0  Easily annoyed or irritable 0 0  Afraid - awful might happen 0 0  Total GAD 7 Score 0 0  Anxiety Difficulty Not difficult at all -    She does not have a history of falls. I did complete a risk assessment for falls. A plan of care for falls was documented. Fall Risk  01/22/2021 08/14/2020  Falls in the past year? 0 0  Number falls in past yr: 0 0  Injury with Fall? 0 0  Risk for fall due to : No Fall Risks No Fall Risks  Follow up Falls evaluation completed;Education provided Falls evaluation completed      Surgical History:  History reviewed. No  pertinent surgical history.  Allergies:  Not on File  Social History:  Social History   Socioeconomic History   Marital status: Single    Spouse name: Not on file   Number of children: Not on file   Years of education: Not on file   Highest education level: Not on file  Occupational History   Not on file  Tobacco Use   Smoking status: Never   Smokeless tobacco: Never  Vaping Use   Vaping Use: Never used  Substance and Sexual Activity   Alcohol use: Yes    Alcohol/week: 3.0 standard drinks    Types: 1 Glasses of wine, 1 Cans of beer, 1 Shots of liquor per week    Comment: Socially   Drug use: Never   Sexual activity: Not Currently  Other Topics Concern   Not on file   Social History Narrative   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 [employer]   Social Determinants of Health   Financial Resource Strain: Low Risk    Difficulty of Paying Living Expenses: Not hard at all  Food Insecurity: No Food Insecurity   Worried About Programme researcher, broadcasting/film/video in the Last Year: Never true   Barista in the Last Year: Never true  Transportation Needs: No Transportation Needs   Lack of Transportation (Medical): No   Lack of Transportation (Non-Medical): No  Physical Activity: Sufficiently Active   Days of Exercise per Week: 7 days   Minutes of Exercise per Session: 50 min  Stress: No Stress Concern Present   Feeling of Stress : Only a little  Social Connections: Moderately Isolated   Frequency of Communication with Friends and Family: More than three times a week   Frequency of Social Gatherings with Friends and Family: More than three times a week   Attends Religious Services: 1 to 4 times per year   Active Member of [employer] or Organizations: No   Attends Engineer, structural: Never   Marital Status: Never married  Catering manager Violence: Not At Risk   Fear of Current or Ex-Partner: No   Emotionally Abused: No   Physically Abused: No   Sexually Abused: No   Social History   Tobacco Use  Smoking Status Never  Smokeless Tobacco Never   Social History   Substance and Sexual Activity  Alcohol Use Yes   Alcohol/week: 3.0 standard drinks   Types: 1 Glasses of wine, 1 Cans of beer, 1 Shots of liquor per week   Comment: Socially    Family History:  Family History  Problem Relation Age of Onset   Asthma Mother    Hyperlipidemia Father    Birth defects Paternal Grandfather    Diabetes Maternal Aunt     Past medical history, surgical history, medications, allergies, family history and social history reviewed with patient today and changes made to appropriate areas of the chart.   All ROS negative except what is  listed above and in the HPI.      Objective:    BP 130/82   Pulse 98   Ht  (1.626 m)   Wt 150 lb 3.2 oz (68.1 kg)   SpO2 99%   BMI 25.78 kg/m   Wt Readings from Last 3 Encounters:  01/22/21 150 lb 3.2 oz (68.1 kg)  08/14/20 146 lb 3.2 oz (66.3 kg)    Physical Exam  Results for orders placed or performed in visit on 11/19/20  HM MAMMOGRAPHY  Result  Value Ref Range   HM Mammogram 0-4 Bi-Rad 0-4 Bi-Rad, Self Reported Normal  OB RESULTS CONSOLE GC/Chlamydia  Result Value Ref Range   Chlamydia Negative   HIV Antibody (routine testing w rflx)  Result Value Ref Range   HIV 1&2 Ab, 4th Generation negative   Microalbumin, urine  Result Value Ref Range   Microalb, Ur negative   HM HIV SCREENING LAB  Result Value Ref Range   HM HIV Screening Negative - Validated   HM HEPATITIS C SCREENING LAB  Result Value Ref Range   HM Hepatitis Screen Negative-Validated   CBC and differential  Result Value Ref Range   Hemoglobin 13.4 12.0 - 16.0   HCT 38 36 - 46   Platelets 329 150 - 399   WBC 6.1   CBC  Result Value Ref Range   RBC 4.14 3.87 - 5.11  VITAMIN D 25 Hydroxy (Vit-D Deficiency, Fractures)  Result Value Ref Range   Vit D, 25-Hydroxy 137.0   HM PAP SMEAR  Result Value Ref Range   HM Pap smear NILM   Basic metabolic panel  Result Value Ref Range   Glucose 84    BUN 14 4 - 21   CO2 24 (A) 13 - 22   Creatinine 1.0 0.5 - 1.1   Potassium 3.8 3.4 - 5.3   Sodium 140 137 - 147   Chloride 98 (A) 99 - 108  Comprehensive metabolic panel  Result Value Ref Range   Globulin 2.4    GFR calc Af Amer 85    GFR calc non Af Amer 74    Calcium 9.8 8.7 - 10.7   Albumin 5.0 3.5 - 5.0  Lipid panel  Result Value Ref Range   LDl/HDL Ratio 14.0    Triglycerides 76 40 - 160   Cholesterol 191 0 - 200   HDL 88 (A) 35 - 70   LDL Cholesterol 89   Hepatic function panel  Result Value Ref Range   ALT 27 7 - 35   AST 19 13 - 35   Bilirubin, Total 0.4   Hepatitis B surface antigen   Result Value Ref Range   Hepatitis B Surface Ag negative surface antigen   Hemoglobin A1c  Result Value Ref Range   Hemoglobin A1C 5.1%   TSH  Result Value Ref Range   TSH 2.22 0.41 - 5.90  Results Console HPV  Result Value Ref Range   CHL HPV Negative       Assessment & Plan:   Problem List Items Addressed This Visit   None Visit Diagnoses     Encounter for annual physical exam    -  Primary   Relevant Orders   CBC with Differential/Platelet   Comprehensive metabolic panel   Lipid panel   Menstrual cycle problem       Relevant Orders   TSH   VITAMIN D 25 Hydroxy (Vit-D Deficiency, Fractures)   Alb+Prl+FSH+LH+Prog+DHEA+Es...   Family history of diabetes mellitus (DM)       Relevant Orders   Hemoglobin A1c   Screening for endocrine, nutritional, metabolic and immunity disorder       Relevant Orders   CBC with Differential/Platelet   Comprehensive metabolic panel   Lipid panel   Hemoglobin A1c   TSH   VITAMIN D 25 Hydroxy (Vit-D Deficiency, Fractures)   Alb+Prl+FSH+LH+Prog+DHEA+Es...        Follow up plan: Return in about 1 year (around 01/22/2022) for CPE with labs.  LABORATORY TESTING:  - Pap smear: up to date - STI testing: deferred  IMMUNIZATIONS:   - Tdap: Tetanus vaccination status reviewed: tetanus status unknown to the patient. - Influenza: Up to date - Pneumovax: Not applicable - Prevnar: Not applicable - HPV: Not applicable - Zostavax vaccine: Not applicable  SCREENING: -Mammogram: Up to date  - Colonoscopy: Not applicable  - Bone Density: Not applicable  -Hearing Test: Not applicable  -Spirometry: Not applicable   PATIENT COUNSELING:   For all adult patients, I recommend   A well balanced diet low in saturated fats, cholesterol, and moderation in carbohydrates.   This can be as simple as monitoring portion sizes and cutting back on sugary beverages such as soda and  juice to start with.    Daily water consumption of at least 64  ounces.  Physical activity at least 180 minutes per week, if just starting out.   This can be as simple as taking the stairs instead of the elevator and walking 2-3 laps around the office  purposefully every day.   STD protection, partner selection, and regular testing if high risk.  Limited consumption of alcoholic beverages if alcohol is consumed.  For women, I recommend no more than 7 alcoholic beverages per week, spread out throughout the week.  Avoid "binge" drinking or consuming large quantities of alcohol in one setting.   Please let me know if you feel you may need help with reduction or quitting alcohol consumption.   Avoidance of nicotine, if used.  Please let me know if you feel you may need help with reduction or quitting nicotine use.   Daily mental health attention.  This can be in the form of 5 minute daily meditation, prayer, journaling, yoga, reflection, etc.   Purposeful attention to your emotions and mental state can significantly improve your overall wellbeing  and  Health.  Please know that I am here to help you with all of your health care goals and am happy to work with you to find a solution that works best for you.  The greatest advice I have received with any changes in life are to take it one step at a time, that even means if all you can focus on is the next 60 seconds, then do that and celebrate your victories.  With any changes in life, you will have set backs, and that is OK. The important thing to remember is, if you have a set back, it is not a failure, it is an opportunity to try again!  Health Maintenance Recommendations Screening Testing Mammogram Every 1 -2 years based on history and risk factors Starting at age 61 Pap Smear Ages 21-39 every 3 years Ages 12-65 every 5 years with HPV testing More frequent testing may be required based on results and history Colon Cancer Screening Every 1-10 years based on test performed, risk factors, and  history Starting at age 20 Bone Density Screening Every 2-10 years based on history Starting at age 16 for women Recommendations for men differ based on medication usage, history, and risk factors AAA Screening One time ultrasound Men 22-17 years old who have every smoked Lung Cancer Screening Low Dose Lung CT every 12 months Age 64-80 years with a 30 pack-year smoking history who still smoke or who have quit within the last 15 years  Screening Labs Routine  Labs: Complete Blood Count (CBC), Complete Metabolic Panel (CMP), Cholesterol (Lipid Panel) Every 6-12 months based on history and medications May be recommended  more frequently based on current conditions or previous results Hemoglobin A1c Lab Every 3-12 months based on history and previous results Starting at age 73 or earlier with diagnosis of diabetes, high cholesterol, BMI >26, and/or risk factors Frequent monitoring for patients with diabetes to ensure blood sugar control Thyroid Panel (TSH w/ T3 & T4) Every 6 months based on history, symptoms, and risk factors May be repeated more often if on medication HIV One time testing for all patients 36 and older May be repeated more frequently for patients with increased risk factors or exposure Hepatitis C One time testing for all patients 17 and older May be repeated more frequently for patients with increased risk factors or exposure Gonorrhea, Chlamydia Every 12 months for all sexually active persons 13-24 years Additional monitoring may be recommended for those who are considered high risk or who have symptoms PSA Men 76-25 years old with risk factors Additional screening may be recommended from age 4-69 based on risk factors, symptoms, and history  Vaccine Recommendations Tetanus Booster All adults every 10 years Flu Vaccine All patients 6 months and older every year COVID Vaccine All patients 12 years and older Initial dosing with booster May recommend  additional booster based on age and health history HPV Vaccine 2 doses all patients age 71-26 Dosing may be considered for patients over 26 Shingles Vaccine (Shingrix) 2 doses all adults 55 years and older Pneumonia (Pneumovax 23) All adults 65 years and older May recommend earlier dosing based on health history Pneumonia (Prevnar 49) All adults 65 years and older Dosed 1 year after Pneumovax 23  Additional Screening, Testing, and Vaccinations may be recommended on an individualized basis based on family history, health history, risk factors, and/or exposure.      NEXT PREVENTATIVE PHYSICAL DUE IN 1 YEAR. Return in about 1 year (around 01/22/2022) for CPE with labs.

## 2021-01-22 NOTE — Patient Instructions (Addendum)
We will get labs today. You will see the results in your MyChart as soon as they come back from the lab. Please allow at least 3 business days for me to get the lab results and review them.  Someone from the office will contact you or we will send you a MyChart message with the results and any recommendations that we have based on these.  If you are not fasting today and would like to come back for the labs please ask at checkout to be placed on the lab schedule a time that is convenient for you.

## 2021-01-26 LAB — COMPREHENSIVE METABOLIC PANEL
ALT: 16 IU/L (ref 0–32)
AST: 17 IU/L (ref 0–40)
Albumin/Globulin Ratio: 2.4 — ABNORMAL HIGH (ref 1.2–2.2)
Alkaline Phosphatase: 74 IU/L (ref 44–121)
BUN/Creatinine Ratio: 10 (ref 9–23)
BUN: 8 mg/dL (ref 6–24)
Bilirubin Total: 0.2 mg/dL (ref 0.0–1.2)
CO2: 25 mmol/L (ref 20–29)
Calcium: 9.3 mg/dL (ref 8.7–10.2)
Chloride: 102 mmol/L (ref 96–106)
Creatinine, Ser: 0.83 mg/dL (ref 0.57–1.00)
Globulin, Total: 2 g/dL (ref 1.5–4.5)
Glucose: 69 mg/dL — ABNORMAL LOW (ref 70–99)
Potassium: 4 mmol/L (ref 3.5–5.2)
Sodium: 140 mmol/L (ref 134–144)
Total Protein: 6.8 g/dL (ref 6.0–8.5)
eGFR: 89 mL/min/{1.73_m2} (ref >59)

## 2021-01-26 LAB — LIPID PANEL
Chol/HDL Ratio: 2.3 ratio (ref 0.0–4.4)
Cholesterol, Total: 172 mg/dL (ref 100–199)
HDL: 74 mg/dL (ref >39)
LDL Chol Calc (NIH): 79 mg/dL (ref 0–99)
Triglycerides: 106 mg/dL (ref 0–149)
VLDL Cholesterol Cal: 19 mg/dL (ref 5–40)

## 2021-01-26 LAB — CBC WITH DIFFERENTIAL/PLATELET
Basophils Absolute: 0.1 10*3/uL (ref 0.0–0.2)
Basos: 1 %
EOS (ABSOLUTE): 0.1 10*3/uL (ref 0.0–0.4)
Eos: 1 %
Hematocrit: 36.3 % (ref 34.0–46.6)
Hemoglobin: 13 g/dL (ref 11.1–15.9)
Immature Grans (Abs): 0 10*3/uL (ref 0.0–0.1)
Immature Granulocytes: 0 %
Lymphocytes Absolute: 1.8 10*3/uL (ref 0.7–3.1)
Lymphs: 22 %
MCH: 33.3 pg — ABNORMAL HIGH (ref 26.6–33.0)
MCHC: 35.8 g/dL — ABNORMAL HIGH (ref 31.5–35.7)
MCV: 93 fL (ref 79–97)
Monocytes Absolute: 0.4 10*3/uL (ref 0.1–0.9)
Monocytes: 5 %
Neutrophils Absolute: 5.6 10*3/uL (ref 1.4–7.0)
Neutrophils: 71 %
Platelets: 307 10*3/uL (ref 150–450)
RBC: 3.9 x10E6/uL (ref 3.77–5.28)
RDW: 12.5 % (ref 11.7–15.4)
WBC: 7.9 10*3/uL (ref 3.4–10.8)

## 2021-01-26 LAB — ALB+PRL+FSH+LH+PROG+DHEA+ES...
Albumin: 4.8 g/dL (ref 3.8–4.8)
Dehydroepiandrosterone: 246 ng/dL (ref 31–701)
Estrogen: 292 pg/mL
FSH: 1.6 m[IU]/mL
LH: 1.3 m[IU]/mL
Progesterone: 0.1 ng/mL
Prolactin: 23.6 ng/mL — ABNORMAL HIGH (ref 4.8–23.3)
Sex Hormone Binding: 128 nmol/L — ABNORMAL HIGH (ref 24.6–122.0)
Vit D, 25-Hydroxy: 50.8 ng/mL (ref 30.0–100.0)

## 2021-01-26 LAB — HEMOGLOBIN A1C
Est. average glucose Bld gHb Est-mCnc: 103 mg/dL
Hgb A1c MFr Bld: 5.2 % (ref 4.8–5.6)

## 2021-01-26 LAB — TSH: TSH: 1.67 u[IU]/mL (ref 0.450–4.500)

## 2021-05-04 ENCOUNTER — Other Ambulatory Visit (HOSPITAL_BASED_OUTPATIENT_CLINIC_OR_DEPARTMENT_OTHER): Payer: Self-pay

## 2021-07-28 ENCOUNTER — Other Ambulatory Visit (HOSPITAL_BASED_OUTPATIENT_CLINIC_OR_DEPARTMENT_OTHER): Payer: Self-pay

## 2021-10-12 ENCOUNTER — Other Ambulatory Visit (HOSPITAL_BASED_OUTPATIENT_CLINIC_OR_DEPARTMENT_OTHER): Payer: Self-pay

## 2021-12-03 ENCOUNTER — Other Ambulatory Visit (HOSPITAL_BASED_OUTPATIENT_CLINIC_OR_DEPARTMENT_OTHER): Payer: Self-pay

## 2021-12-03 MED ORDER — COVID-19 MRNA 2023-2024 VACCINE (COMIRNATY) 0.3 ML INJECTION
INTRAMUSCULAR | 0 refills | Status: DC
  Filled 2021-12-03: qty 0.3, 1d supply, fill #0

## 2021-12-03 MED ORDER — INFLUENZA VAC SPLIT QUAD 0.5 ML IM SUSY
PREFILLED_SYRINGE | INTRAMUSCULAR | 0 refills | Status: DC
  Filled 2021-12-03: qty 0.5, 1d supply, fill #0

## 2022-01-05 ENCOUNTER — Other Ambulatory Visit (HOSPITAL_BASED_OUTPATIENT_CLINIC_OR_DEPARTMENT_OTHER): Payer: Self-pay | Admitting: Nurse Practitioner

## 2022-01-05 ENCOUNTER — Other Ambulatory Visit (HOSPITAL_BASED_OUTPATIENT_CLINIC_OR_DEPARTMENT_OTHER): Payer: Self-pay

## 2022-01-05 MED ORDER — NORETHIN ACE-ETH ESTRAD-FE 1-20 MG-MCG PO TABS
1.0000 | ORAL_TABLET | Freq: Every day | ORAL | 3 refills | Status: DC
  Filled 2022-01-05: qty 84, 84d supply, fill #0

## 2022-01-28 ENCOUNTER — Encounter (HOSPITAL_BASED_OUTPATIENT_CLINIC_OR_DEPARTMENT_OTHER): Payer: 59 | Admitting: Nurse Practitioner

## 2022-03-05 ENCOUNTER — Encounter: Payer: Self-pay | Admitting: Internal Medicine

## 2022-03-17 ENCOUNTER — Other Ambulatory Visit (HOSPITAL_BASED_OUTPATIENT_CLINIC_OR_DEPARTMENT_OTHER): Payer: Self-pay

## 2022-03-17 DIAGNOSIS — Z1329 Encounter for screening for other suspected endocrine disorder: Secondary | ICD-10-CM | POA: Diagnosis not present

## 2022-03-17 DIAGNOSIS — Z13220 Encounter for screening for lipoid disorders: Secondary | ICD-10-CM | POA: Diagnosis not present

## 2022-03-17 DIAGNOSIS — Z124 Encounter for screening for malignant neoplasm of cervix: Secondary | ICD-10-CM | POA: Diagnosis not present

## 2022-03-17 DIAGNOSIS — Z130 Encounter for screening for diseases of the blood and blood-forming organs and certain disorders involving the immune mechanism: Secondary | ICD-10-CM | POA: Diagnosis not present

## 2022-03-17 LAB — HM MAMMOGRAPHY

## 2022-03-17 MED ORDER — NORETHIN ACE-ETH ESTRAD-FE 1-20 MG-MCG PO TABS
1.0000 | ORAL_TABLET | Freq: Every day | ORAL | 4 refills | Status: DC
Start: 2022-03-17 — End: 2023-05-17
  Filled 2022-03-17: qty 84, 84d supply, fill #0
  Filled 2022-06-18 – 2022-06-28 (×2): qty 84, 84d supply, fill #1
  Filled 2022-09-17: qty 84, 84d supply, fill #2
  Filled 2022-12-08: qty 84, 84d supply, fill #3
  Filled 2023-03-02: qty 84, 84d supply, fill #4

## 2022-03-18 LAB — HEMOGLOBIN A1C: A1c: 5.3

## 2022-03-18 LAB — CMP 10231: EGFR (Non-African Amer.): 86

## 2022-03-23 LAB — HM PAP SMEAR: HPV, high-risk: NEGATIVE

## 2022-06-02 ENCOUNTER — Ambulatory Visit (INDEPENDENT_AMBULATORY_CARE_PROVIDER_SITE_OTHER): Payer: 59 | Admitting: Family Medicine

## 2022-06-02 ENCOUNTER — Encounter (HOSPITAL_BASED_OUTPATIENT_CLINIC_OR_DEPARTMENT_OTHER): Payer: Self-pay | Admitting: Family Medicine

## 2022-06-02 VITALS — BP 123/81 | HR 73 | Ht 64.0 in | Wt 153.1 lb

## 2022-06-02 DIAGNOSIS — Z7689 Persons encountering health services in other specified circumstances: Secondary | ICD-10-CM

## 2022-06-02 DIAGNOSIS — Z1211 Encounter for screening for malignant neoplasm of colon: Secondary | ICD-10-CM | POA: Diagnosis not present

## 2022-06-02 NOTE — Progress Notes (Signed)
   Established Patient Office Visit  Subjective   Patient ID: Crystal Lawson., female    DOB: 11-24-1975  Age: 47 y.o. MRN: [id number]  Crystal Lawson is 47 yo female patient who is establishing care with myself.   Patient had her annual physical completed in Jan 2024 with [facility]  Will obtain records Reviewed labs done in Jan 2024 on patient's phone   Reports no changes to past medical history, surgical history or family history.  Discussed colorectal cancer screening options. She would like to proceed with colonoscopy.    SCREENING: -Mammogram: up to date, recently done in 02/2022 - Colonoscopy: ordered  - Bone Density: Not applicable  - Pap smear: up to date, due 2025  Review of Systems  Constitutional:  Negative for malaise/fatigue.  HENT:  Negative for ear pain and tinnitus.   Eyes:  Negative for blurred vision and double vision.  Respiratory:  Negative for cough and shortness of breath.   Cardiovascular:  Negative for chest pain.  Gastrointestinal:  Negative for abdominal pain, nausea and vomiting.  Musculoskeletal:  Negative for myalgias.  Neurological:  Negative for dizziness, weakness and headaches.  Psychiatric/Behavioral:  Negative for depression and suicidal ideas. The patient is not nervous/anxious.    Objective:    BP 123/81   Pulse 73   Ht 5\' 4"  (1.626 m)   Wt 153 lb 1.6 oz (69.4 kg)   LMP 05/28/2022   SpO2 99%   BMI 26.28 kg/m  BP Readings from Last 3 Encounters:  06/02/22 123/81  01/22/21 130/82  08/14/20 117/79   Physical Exam Constitutional:      Appearance: Normal appearance.  Cardiovascular:     Rate and Rhythm: Normal rate and regular rhythm.     Pulses: Normal pulses.     Heart sounds: Normal heart sounds.  Pulmonary:     Effort: Pulmonary effort is normal.     Breath sounds: Normal breath sounds.  Neurological:     Mental Status: She is alert.  Psychiatric:        Mood and Affect: Mood normal.        Behavior: Behavior  normal.        Thought Content: Thought content normal.        Judgment: Judgment normal.    Assessment & Plan:  1. Encounter to establish care Patient is here to establish care with myself. She was seeing the provider who previously worked at this office. She would like to stay with this practice.   2. Colon cancer screening Patient had her annual physical completed in January 2024 with [facility]. However, they were unable to order her screening colonoscopy. Referral placed today GI.  - Ambulatory referral to Gastroenterology   Return in about 1 year (around 06/02/2023) for Physical with fasting labs.    Alyson Reedy, FNP

## 2022-06-18 ENCOUNTER — Other Ambulatory Visit: Payer: Self-pay

## 2022-06-18 ENCOUNTER — Other Ambulatory Visit (HOSPITAL_BASED_OUTPATIENT_CLINIC_OR_DEPARTMENT_OTHER): Payer: Self-pay

## 2022-06-23 ENCOUNTER — Other Ambulatory Visit (HOSPITAL_BASED_OUTPATIENT_CLINIC_OR_DEPARTMENT_OTHER): Payer: Self-pay

## 2022-06-28 ENCOUNTER — Other Ambulatory Visit: Payer: Self-pay

## 2022-10-29 IMAGING — MG DIGITAL DIAGNOSTIC BILAT W/ TOMO W/ CAD
8 series · 8 of 24 positions shown · non-contrast
Comparison: Previous exam(s).

CLINICAL DATA: 44-year-old female presenting for annual bilateral
mammogram and final follow-up of probably benign left breast masses.

EXAM:
DIGITAL DIAGNOSTIC BILATERAL MAMMOGRAM WITH TOMOSYNTHESIS AND CAD;
ULTRASOUND LEFT BREAST LIMITED
TECHNIQUE: Bilateral digital diagnostic mammography and breast tomosynthesis
was performed. The images were evaluated with computer-aided
detection.; Targeted ultrasound examination of the left breast was
performed

[L CC synth-2D]
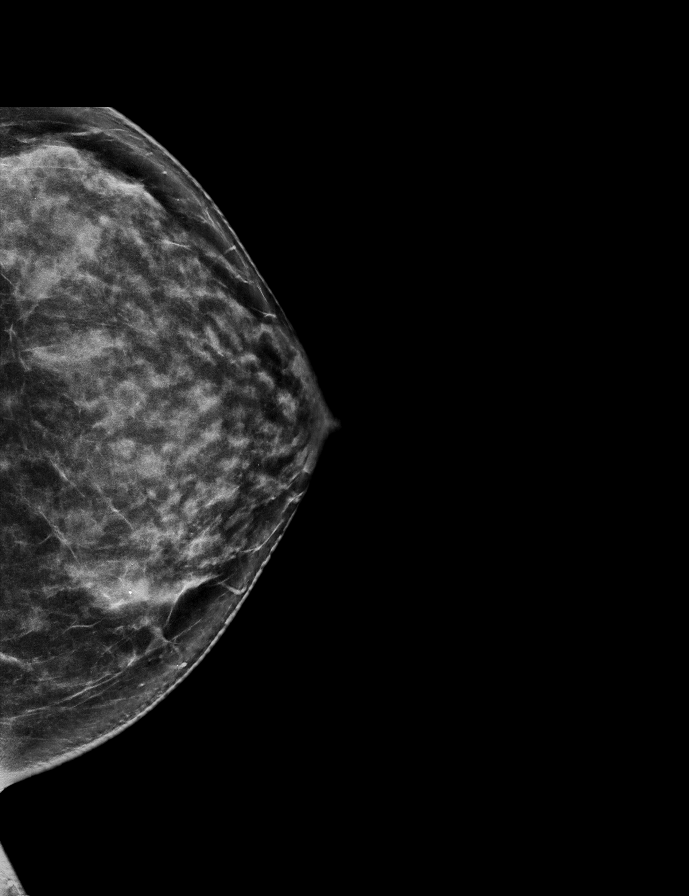

[R CC synth-2D]
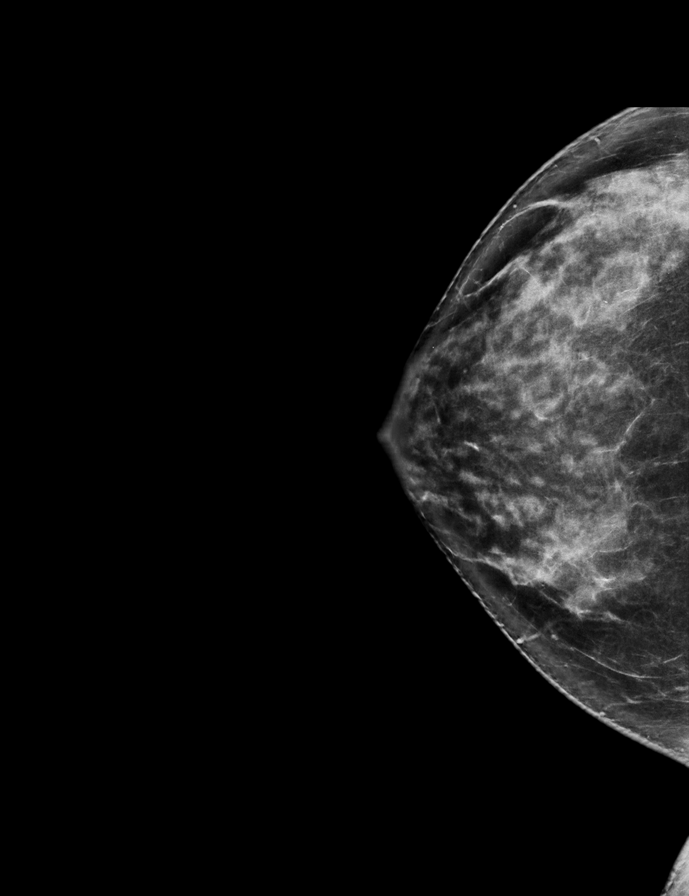

[R MLO synth-2D]
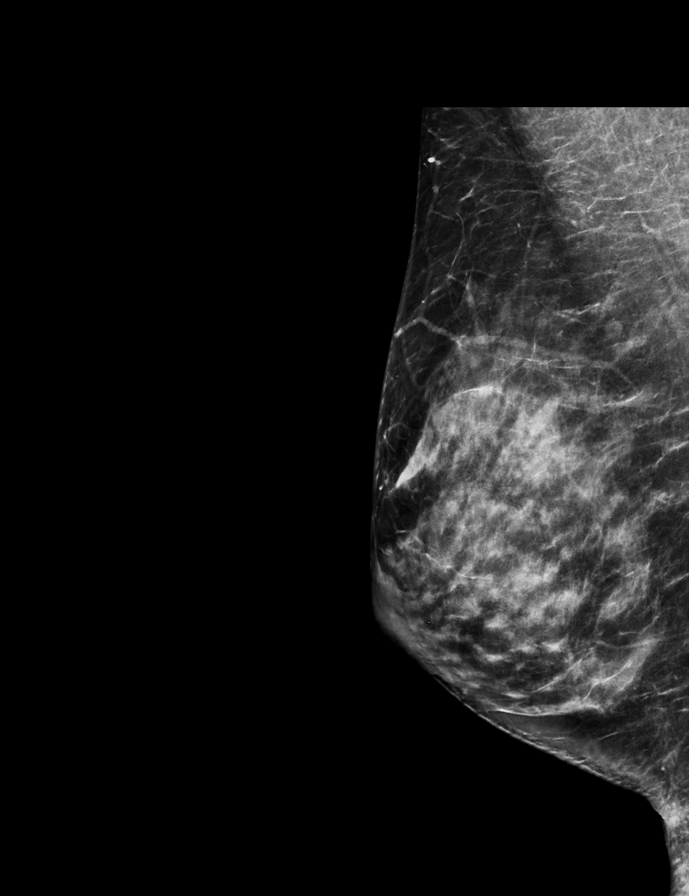

[L MLO synth-2D]
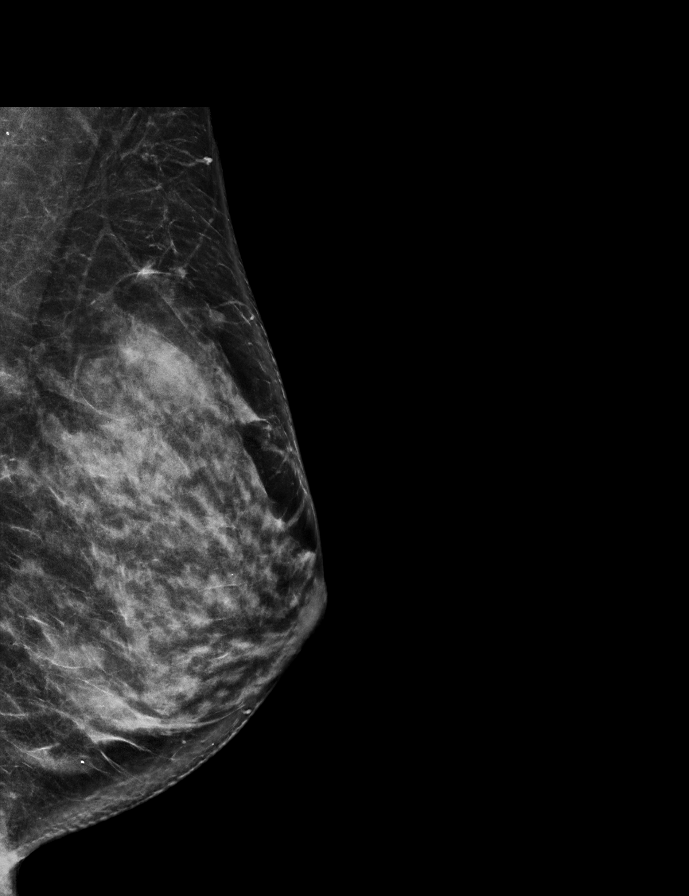

[L CC tomo · tomo slice 35/69.0]
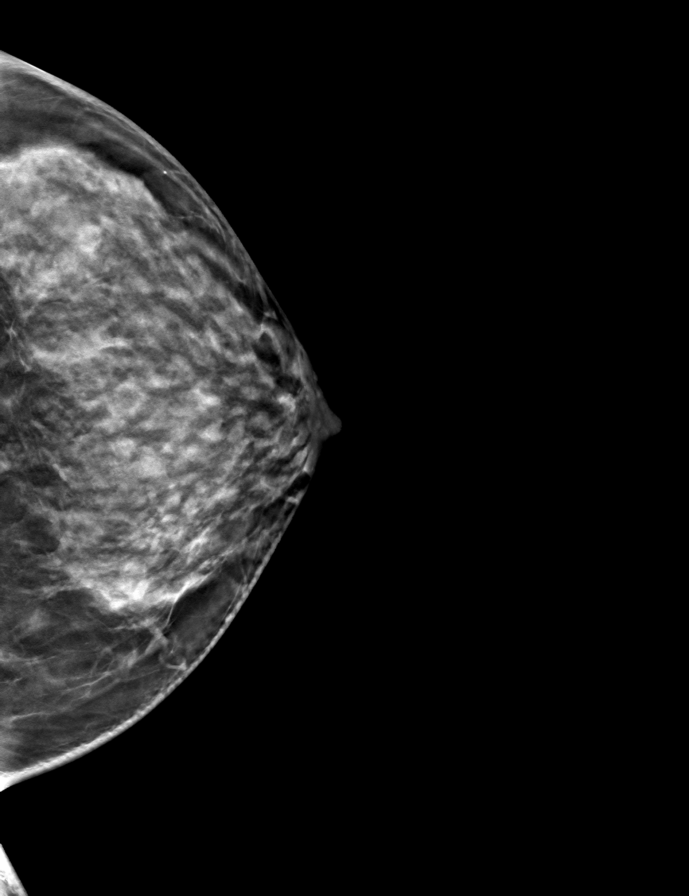

[R MLO tomo · tomo slice 35/70.0]
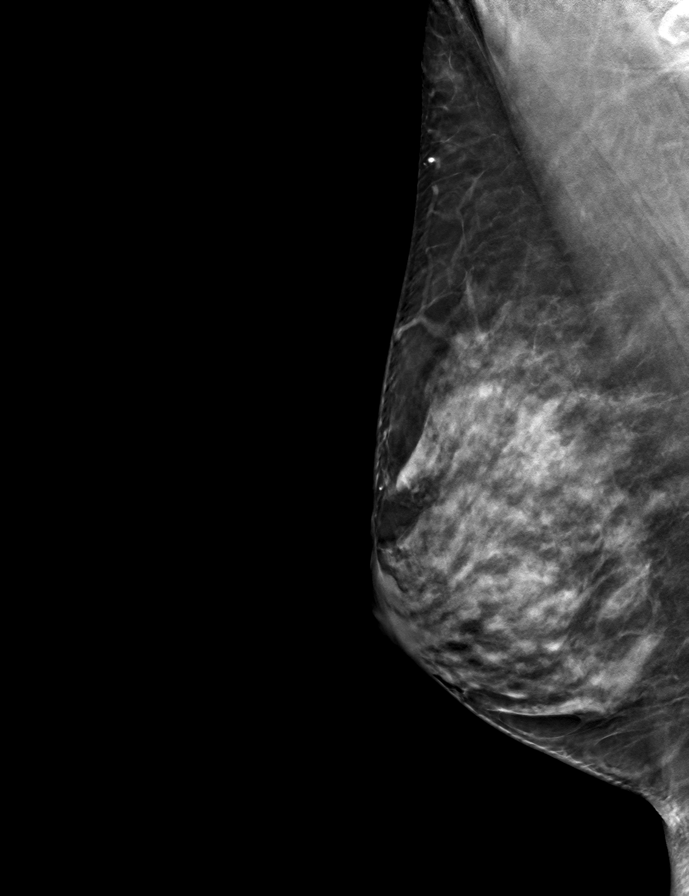

[L MLO tomo · tomo slice 35/70.0]
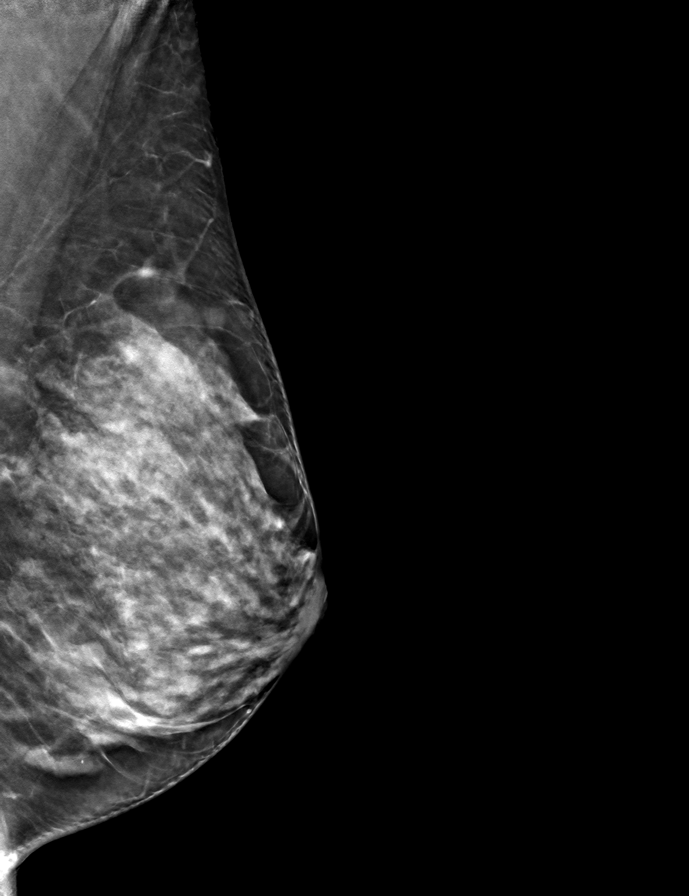

[R CC tomo · tomo slice 38/75.0]
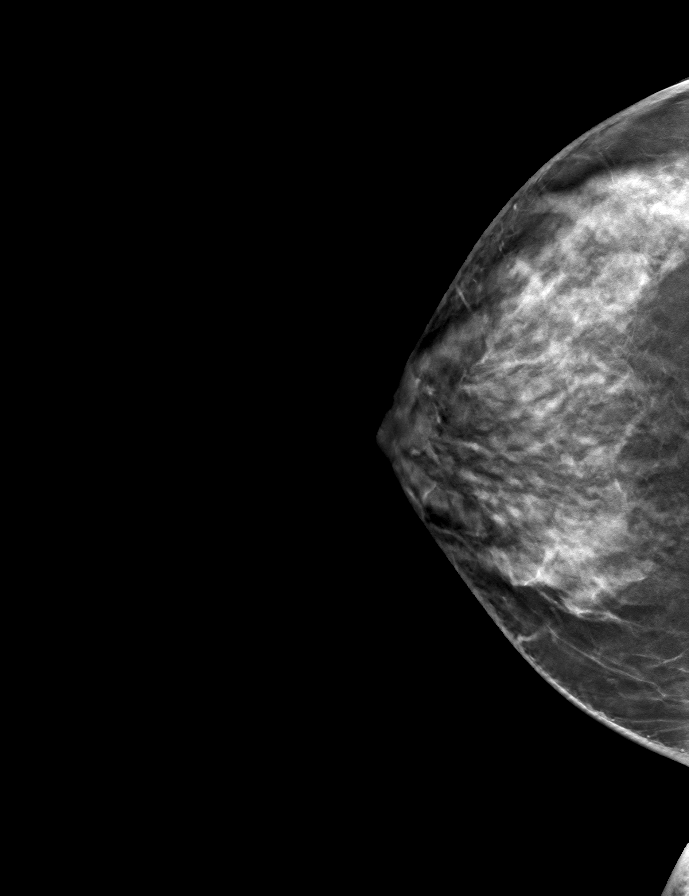

[8 of 24 positions shown; findings below may reference images not displayed]

ACR Breast Density Category d: The breast tissue is extremely dense,
which lowers the sensitivity of mammography.
FINDINGS: No suspicious mammographic findings are identified in either breast.
A mass in the lower inner quadrant of the left breast is
mammographically stable. The parenchymal pattern is unchanged

Targeted ultrasound is performed, showing stable appearance of an
oval, circumscribed hypoechoic mass at the 8 o'clock position 4 cm
from the nipple. It measures 1.6 x 0.9 x 0.4 cm (previously 1.7 x
1.0 x 0.4 cm). Additional masses at the 2 o'clock position have
resolved in the interim.
IMPRESSION: 1. Benign left breast mass demonstrating greater than 2 year
stability. No further imaging follow-up required.
2. Otherwise, no mammographic evidence of malignancy in either
breast.

RECOMMENDATION:
Screening mammogram in one year.(Code:SD-4-D2D)

I have discussed the findings and recommendations with the patient.
If applicable, a reminder letter will be sent to the patient
regarding the next appointment.

BI-RADS CATEGORY  2: Benign.

## 2022-11-11 ENCOUNTER — Other Ambulatory Visit (HOSPITAL_BASED_OUTPATIENT_CLINIC_OR_DEPARTMENT_OTHER): Payer: Self-pay

## 2022-11-11 MED ORDER — COMIRNATY 30 MCG/0.3ML IM SUSY
0.3000 mL | PREFILLED_SYRINGE | Freq: Once | INTRAMUSCULAR | 0 refills | Status: AC
  Filled 2022-11-11: qty 0.3, 1d supply, fill #0

## 2022-11-11 MED ORDER — FLULAVAL 0.5 ML IM SUSY
0.5000 mL | PREFILLED_SYRINGE | Freq: Once | INTRAMUSCULAR | 0 refills | Status: AC
  Filled 2022-11-11: qty 0.5, 1d supply, fill #0

## 2023-01-13 ENCOUNTER — Encounter (HOSPITAL_BASED_OUTPATIENT_CLINIC_OR_DEPARTMENT_OTHER): Payer: Self-pay | Admitting: Family Medicine

## 2023-05-16 ENCOUNTER — Telehealth (HOSPITAL_BASED_OUTPATIENT_CLINIC_OR_DEPARTMENT_OTHER): Payer: Self-pay | Admitting: *Deleted

## 2023-05-16 ENCOUNTER — Telehealth (HOSPITAL_BASED_OUTPATIENT_CLINIC_OR_DEPARTMENT_OTHER): Payer: Self-pay | Admitting: Family Medicine

## 2023-05-16 NOTE — Telephone Encounter (Signed)
 Can you send in a refill for Birth Control for the patient, she has an appointment coming up with Jon Gills. Pt was under the impression since she was billed for you visit in April that she couldn't have another exam, I advised that was not the case.   The patient stated that the appointment in April should have been a preventive office visit, and she should not have been billed.  Advised the patient that I would have someone look into this and get back with her - however its been almost a year, and not sure wha we could do at this point.

## 2023-05-16 NOTE — Telephone Encounter (Signed)
 Copied from CRM [id number]. Topic: Clinical - Prescription Issue >> May 16, 2023  1:35 PM Victorino Dike T wrote: Reason for CRM: norethindrone-ethinyl estradiol-FE (BLISOVI FE 1/20) 1-20 MG-MCG tablet- does not want to go through OBGYN for refill- please advise if this can be sent in by [employer]-  [phone]

## 2023-05-16 NOTE — Telephone Encounter (Signed)
 Called pt to reschedule appt and she stated if there was a way to get her Blisovi FE refilled due to her having one week left and her prescription being expired. She also wanted to know if she would be able to get a mammogram on the same day of her appt that was rescheduled for May. Please advise pt

## 2023-05-16 NOTE — Telephone Encounter (Signed)
 [id number]

## 2023-05-17 ENCOUNTER — Other Ambulatory Visit (HOSPITAL_BASED_OUTPATIENT_CLINIC_OR_DEPARTMENT_OTHER): Payer: Self-pay

## 2023-05-17 ENCOUNTER — Other Ambulatory Visit (HOSPITAL_BASED_OUTPATIENT_CLINIC_OR_DEPARTMENT_OTHER): Payer: Self-pay | Admitting: Family Medicine

## 2023-05-17 ENCOUNTER — Other Ambulatory Visit: Payer: Self-pay | Admitting: Family Medicine

## 2023-05-17 DIAGNOSIS — Z1231 Encounter for screening mammogram for malignant neoplasm of breast: Secondary | ICD-10-CM

## 2023-05-17 MED ORDER — BLISOVI FE 1/20 1-20 MG-MCG PO TABS
1.0000 | ORAL_TABLET | Freq: Every day | ORAL | 4 refills | Status: AC
  Filled 2023-05-17: qty 84, 84d supply, fill #0
  Filled 2023-08-23: qty 84, 84d supply, fill #1
  Filled 2023-11-10: qty 84, 84d supply, fill #2

## 2023-05-17 NOTE — Addendum Note (Signed)
 Addended by: Carleene Mains A on: 05/17/2023 09:22 AM   Modules accepted: Orders

## 2023-05-17 NOTE — Telephone Encounter (Signed)
 Called patient to advise

## 2023-05-17 NOTE — Telephone Encounter (Signed)
 Alexis, please advise if you are okay refilling medication for pt.former Foye Clock pt who had to reschedule her appt with you.

## 2023-05-17 NOTE — Telephone Encounter (Signed)
 Called and spoke with pt letting her know the info per [place] and let her know that Foye Clock had refilled her birth control. Pt verbalized understanding. Nothing further needed.

## 2023-05-18 ENCOUNTER — Other Ambulatory Visit (HOSPITAL_BASED_OUTPATIENT_CLINIC_OR_DEPARTMENT_OTHER): Payer: Self-pay

## 2023-05-31 ENCOUNTER — Encounter: Payer: Self-pay | Admitting: Gastroenterology

## 2023-06-01 ENCOUNTER — Ambulatory Visit (HOSPITAL_BASED_OUTPATIENT_CLINIC_OR_DEPARTMENT_OTHER): Payer: 59 | Admitting: Family Medicine

## 2023-06-22 ENCOUNTER — Other Ambulatory Visit (HOSPITAL_BASED_OUTPATIENT_CLINIC_OR_DEPARTMENT_OTHER): Payer: Self-pay

## 2023-06-22 ENCOUNTER — Ambulatory Visit (AMBULATORY_SURGERY_CENTER)

## 2023-06-22 VITALS — Ht 64.0 in | Wt 150.0 lb

## 2023-06-22 DIAGNOSIS — Z1211 Encounter for screening for malignant neoplasm of colon: Secondary | ICD-10-CM

## 2023-06-22 MED ORDER — NA SULFATE-K SULFATE-MG SULF 17.5-3.13-1.6 GM/177ML PO SOLN
1.0000 | Freq: Once | ORAL | 0 refills | Status: AC
  Filled 2023-06-22: qty 354, 1d supply, fill #0

## 2023-06-22 NOTE — Progress Notes (Signed)
 No egg or soy allergy known to patient  No issues known to pt with past sedation with any surgeries or procedures Patient denies ever being told they had issues or difficulty with intubation  No FH of Malignant Hyperthermia Pt is not on diet pills nor GLP-1 medications Pt is not on  home 02  Pt is not on blood thinners  Pt states occasional issues with chronic constipation  No A fib or A flutter Have any cardiac testing pending--no Ambulates independently

## 2023-06-27 ENCOUNTER — Other Ambulatory Visit (HOSPITAL_BASED_OUTPATIENT_CLINIC_OR_DEPARTMENT_OTHER): Payer: Self-pay

## 2023-06-28 ENCOUNTER — Encounter: Payer: Self-pay | Admitting: Gastroenterology

## 2023-07-01 ENCOUNTER — Encounter (HOSPITAL_COMMUNITY): Payer: Self-pay

## 2023-07-04 ENCOUNTER — Encounter (HOSPITAL_COMMUNITY): Payer: Self-pay

## 2023-07-08 ENCOUNTER — Ambulatory Visit (HOSPITAL_BASED_OUTPATIENT_CLINIC_OR_DEPARTMENT_OTHER)
Admission: RE | Admit: 2023-07-08 | Discharge: 2023-07-08 | Disposition: A | Discharge status: Home / Self Care | Source: Ambulatory Visit | Attending: Family Medicine | Admitting: Family Medicine

## 2023-07-08 ENCOUNTER — Encounter (HOSPITAL_BASED_OUTPATIENT_CLINIC_OR_DEPARTMENT_OTHER): Payer: Self-pay | Admitting: Radiology

## 2023-07-08 DIAGNOSIS — Z1231 Encounter for screening mammogram for malignant neoplasm of breast: Secondary | ICD-10-CM | POA: Insufficient documentation

## 2023-07-11 ENCOUNTER — Encounter: Payer: Self-pay | Admitting: Gastroenterology

## 2023-07-11 ENCOUNTER — Ambulatory Visit: Admitting: Gastroenterology

## 2023-07-11 VITALS — BP 121/71 | HR 75 | Temp 98.0°F | Resp 12 | Ht 64.0 in | Wt 150.0 lb

## 2023-07-11 DIAGNOSIS — K648 Other hemorrhoids: Secondary | ICD-10-CM

## 2023-07-11 DIAGNOSIS — Z1211 Encounter for screening for malignant neoplasm of colon: Secondary | ICD-10-CM

## 2023-07-11 MED ORDER — SODIUM CHLORIDE 0.9 % IV SOLN: 500.0000 mL | Freq: Once | INTRAVENOUS | Status: DC

## 2023-07-11 NOTE — Op Note (Signed)
 [facility] Patient Name: Crystal Lawson Procedure Date: 07/11/2023 1:31 PM MRN: [id number] Endoscopist: Ace Abu L. Dominic Friendly , MD, [id number] Age: 48 Referring MD:  Date of Birth: Sep 14, 1975 Gender: Female Account #: [id number] Procedure:                Colonoscopy Indications:              Screening for colorectal malignant neoplasm, This                            is the patient's first colonoscopy Medicines:                Monitored Anesthesia Care Procedure:                Pre-Anesthesia Assessment:                           - Prior to the procedure, a History and Physical                            was performed, and patient medications and                            allergies were reviewed. The patient's tolerance of                            previous anesthesia was also reviewed. The risks                            and benefits of the procedure and the sedation                            options and risks were discussed with the patient.                            All questions were answered, and informed consent                            was obtained. Prior Anticoagulants: The patient has                            taken no anticoagulant or antiplatelet agents. ASA                            Grade Assessment: I - A normal, healthy patient.                            After reviewing the risks and benefits, the patient                            was deemed in satisfactory condition to undergo the                            procedure.  After obtaining informed consent, the colonoscope                            was passed under direct vision. Throughout the                            procedure, the patient's blood pressure, pulse, and                            oxygen saturations were monitored continuously. The                            CF [id number] #[id number] was introduced through the anus                            and advanced to the the cecum,  identified by                            appendiceal orifice and ileocecal valve. The                            colonoscopy was performed without difficulty. The                            patient tolerated the procedure well. The quality                            of the bowel preparation was excellent. The                            ileocecal valve, appendiceal orifice, and rectum                            were photographed. Scope In: 1:41:41 PM Scope Out: 1:53:27 PM Scope Withdrawal Time: 0 hours 7 minutes 47 seconds  Total Procedure Duration: 0 hours 11 minutes 46 seconds  Findings:                 The perianal and digital rectal examinations were                            normal.                           Repeat examination of right colon under NBI                            performed.                           Internal hemorrhoids were found. The hemorrhoids                            were small.  The exam was otherwise without abnormality on                            direct and retroflexion views. Complications:            No immediate complications. Estimated Blood Loss:     Estimated blood loss: none. Impression:               - Internal hemorrhoids.                           - The examination was otherwise normal on direct                            and retroflexion views.                           - No specimens collected. Recommendation:           - Patient has a contact number available for                            emergencies. The signs and symptoms of potential                            delayed complications were discussed with the                            patient. Return to normal activities tomorrow.                            Written discharge instructions were provided to the                            patient.                           - Resume previous diet.                           - Continue present medications.                            - Repeat colonoscopy in 10 years for screening                            purposes. Nuel Dejaynes L. Dominic Friendly, MD 07/11/2023 1:57:35 PM This report has been signed electronically.

## 2023-07-11 NOTE — Progress Notes (Signed)
1350 Ephedrine 10 mg given IV due to low BP, MD updated.   ?

## 2023-07-11 NOTE — Progress Notes (Signed)
 Report given to PACU, vss

## 2023-07-11 NOTE — Progress Notes (Signed)
 VS by [facility]  Pt's states no medical or surgical changes since previsit or office visit.

## 2023-07-11 NOTE — Progress Notes (Signed)
 History and Physical:  This patient presents for endoscopic testing for: Encounter Diagnosis  Name Primary?   Special screening for malignant neoplasms, colon Yes    Average risk for colorectal cancer.  1st screening exam.  Patient denies chronic abdominal pain, rectal bleeding, constipation or diarrhea.  Patient is otherwise without complaints or active issues today.   Past Medical History: History reviewed. No pertinent past medical history.   Past Surgical History: History reviewed. No pertinent surgical history.  Allergies: Not on File  Outpatient Meds: Current Outpatient Medications  Medication Sig Dispense Refill   norethindrone -ethinyl estradiol -FE (BLISOVI  FE 1/20) 1-20 MG-MCG tablet TAKE 1 TABLET BY MOUTH ONCE DAILY 84 tablet 4   Current Facility-Administered Medications  Medication Dose Route Frequency Provider Last Rate Last Admin   0.9 %  sodium chloride  infusion  500 mL Intravenous Once Danis, Dell Briner L III, MD          ___________________________________________________________________ Objective   Exam:  BP (!) 141/85   Pulse 86   Temp 98 F (36.7 C) (Skin)   Resp 14   Ht 5\' 4"  (1.626 m)   Wt 150 lb (68 kg)   LMP 06/24/2023   SpO2 100%   BMI 25.75 kg/m   CV: regular , S1/S2 Resp: clear to auscultation bilaterally, normal RR and effort noted GI: soft, no tenderness, with active bowel sounds.   Assessment: Encounter Diagnosis  Name Primary?   Special screening for malignant neoplasms, colon Yes     Plan: Colonoscopy   The benefits and risks of the planned procedure(s) were described in detail with the patient or (when appropriate) their health care proxy.  Risks were outlined as including, but not limited to, bleeding, infection, perforation, adverse medication reaction leading to cardiac or pulmonary decompensation, pancreatitis (if ERCP).  The limitation of incomplete mucosal visualization was also discussed.  No guarantees or warranties  were given.  The patient is appropriate for an endoscopic procedure in the ambulatory setting.   - Lorella Roles, MD

## 2023-07-11 NOTE — Patient Instructions (Signed)
 Resume previous diet and medications Repeat colonoscopy in 10 years for screening    YOU HAD AN ENDOSCOPIC PROCEDURE TODAY AT THE [facility]:   Refer to the procedure report that was given to you for any specific questions about what was found during the examination.  If the procedure report does not answer your questions, please call your gastroenterologist to clarify.  If you requested that your care partner not be given the details of your procedure findings, then the procedure report has been included in a sealed envelope for you to review at your convenience later.  YOU SHOULD EXPECT: Some feelings of bloating in the abdomen. Passage of more gas than usual.  Walking can help get rid of the air that was put into your GI tract during the procedure and reduce the bloating. If you had a lower endoscopy (such as a colonoscopy or flexible sigmoidoscopy) you may notice spotting of blood in your stool or on the toilet paper. If you underwent a bowel prep for your procedure, you may not have a normal bowel movement for a few days.  Please Note:  You might notice some irritation and congestion in your nose or some drainage.  This is from the oxygen used during your procedure.  There is no need for concern and it should clear up in a day or so.  SYMPTOMS TO REPORT IMMEDIATELY:  Following lower endoscopy (colonoscopy or flexible sigmoidoscopy):  Excessive amounts of blood in the stool  Significant tenderness or worsening of abdominal pains  Swelling of the abdomen that is new, acute  Fever of 100F or higher   For urgent or emergent issues, a gastroenterologist can be reached at any hour by calling [phone]. Do not use MyChart messaging for urgent concerns.    DIET:  We do recommend a small meal at first, but then you may proceed to your regular diet.  Drink plenty of fluids but you should avoid alcoholic beverages for 24 hours.  ACTIVITY:  You should plan to take it easy for  the rest of today and you should NOT DRIVE or use heavy machinery until tomorrow (because of the sedation medicines used during the test).    FOLLOW UP: Our staff will call the number listed on your records the next business day following your procedure.  We will call around 7:15- 8:00 am to check on you and address any questions or concerns that you may have regarding the information given to you following your procedure. If we do not reach you, we will leave a message.     If any biopsies were taken you will be contacted by phone or by letter within the next 1-3 weeks.  Please call us  at [phone] if you have not heard about the biopsies in 3 weeks.    SIGNATURES/CONFIDENTIALITY: You and/or your care partner have signed paperwork which will be entered into your electronic medical record.  These signatures attest to the fact that that the information above on your After Visit Summary has been reviewed and is understood.  Full responsibility of the confidentiality of this discharge information lies with you and/or your care-partner.

## 2023-07-12 ENCOUNTER — Telehealth: Payer: Self-pay

## 2023-07-12 NOTE — Telephone Encounter (Signed)
  Follow up Call-     07/11/2023    1:02 PM  Call back number  Post procedure Call Back phone  # [phone]  Permission to leave phone message Yes     Patient questions:  Do you have a fever, pain , or abdominal swelling? No. Pain Score  0 *  Have you tolerated food without any problems? Yes.    Have you been able to return to your normal activities? Yes.    Do you have any questions about your discharge instructions: Diet   No. Medications  No. Follow up visit  No.  Do you have questions or concerns about your Care? No.  Actions: * If pain score is 4 or above: No action needed, pain <4.

## 2023-07-13 ENCOUNTER — Encounter (HOSPITAL_BASED_OUTPATIENT_CLINIC_OR_DEPARTMENT_OTHER): Payer: Self-pay | Admitting: Family Medicine

## 2023-07-13 ENCOUNTER — Ambulatory Visit (HOSPITAL_BASED_OUTPATIENT_CLINIC_OR_DEPARTMENT_OTHER): Payer: Self-pay | Admitting: Family Medicine

## 2023-07-13 ENCOUNTER — Ambulatory Visit (INDEPENDENT_AMBULATORY_CARE_PROVIDER_SITE_OTHER): Admitting: Family Medicine

## 2023-07-13 VITALS — BP 126/79 | HR 70 | Ht 64.0 in | Wt 149.6 lb

## 2023-07-13 DIAGNOSIS — L819 Disorder of pigmentation, unspecified: Secondary | ICD-10-CM

## 2023-07-13 DIAGNOSIS — Z13220 Encounter for screening for lipoid disorders: Secondary | ICD-10-CM

## 2023-07-13 DIAGNOSIS — Z0000 Encounter for general adult medical examination without abnormal findings: Secondary | ICD-10-CM | POA: Diagnosis not present

## 2023-07-13 NOTE — Patient Instructions (Signed)

## 2023-07-13 NOTE — Progress Notes (Signed)
 Subjective:   Crystal Lawson 1975-09-04  07/13/2023   CC: Chief Complaint  Patient presents with   Annual Exam    Patient is here today for her physical. Denies any concerns for today's visit.    HPI: Crystal Lawson is a 48 y.o. female who presents for a routine health maintenance exam.  Labs collected at time of visit. She eats a balanced diet and is active with exercise. She has no concerns for her visit today.    HEALTH SCREENINGS: - Vision Screening: up to date in Feb 2025 - Dental Visits: up to date - Pap smear: up to date - Breast Exam: up to date - STD Screening: Declined - Mammogram (40+): Completed last week ; awaiting results   - Colonoscopy (45+): Up to date  - Bone Density (65+ or under 65 with predisposing conditions): Not applicable  - Lung CA screening with low-dose CT:  Not applicable Adults age 39-80 who are current cigarette smokers or quit within the last 15 years. Must have 20 pack year history.   Depression and Anxiety Screen done today and results listed below:     07/13/2023    1:58 PM 06/02/2022    1:14 PM 01/22/2021    6:06 PM 08/14/2020    1:27 PM  Depression screen PHQ 2/9  Decreased Interest 0 0 0 1  Down, Depressed, Hopeless 0 0 0 0  PHQ - 2 Score 0 0 0 1  Altered sleeping 0  0 0  Tired, decreased energy 0  0 1  Change in appetite 0  0 0  Feeling bad or failure about yourself  0  0 0  Trouble concentrating 0  0 0  Moving slowly or fidgety/restless 0  0 0  Suicidal thoughts 0  0 0  PHQ-9 Score 0  0 2  Difficult doing work/chores Not difficult at all  Not difficult at all Not difficult at all      07/13/2023    1:58 PM 01/22/2021    6:06 PM 08/14/2020    1:28 PM  GAD 7 : Generalized Anxiety Score  Nervous, Anxious, on Edge 0 0 0  Control/stop worrying 0 0 0  Worry too much - different things 0 0 0  Trouble relaxing 0 0 0  Restless 0 0 0  Easily annoyed or irritable 0 0 0  Afraid - awful might happen 0 0 0  Total GAD 7 Score 0 0 0   Anxiety Difficulty Not difficult at all Not difficult at all     IMMUNIZATIONS: - Tdap: Tetanus vaccination status reviewed: Recommended, pt declined. - HPV: Refused - Influenza: Postponed to flu season - Pneumovax: Not applicable - Prevnar 20: Not applicable - Shingrix (50+): Not applicable   Past medical history, surgical history, medications, allergies, family history and social history reviewed with patient today and changes made to appropriate areas of the chart.   History reviewed. No pertinent past medical history.  History reviewed. No pertinent surgical history.  Current Outpatient Medications on File Prior to Visit  Medication Sig   norethindrone -ethinyl estradiol -FE (BLISOVI  FE 1/20) 1-20 MG-MCG tablet TAKE 1 TABLET BY MOUTH ONCE DAILY   No current facility-administered medications on file prior to visit.    No Known Allergies   Social History   Socioeconomic History   Marital status: Single    Spouse name: Not on file   Number of children: Not on file   Years of education: Not on file   Highest education  level: Not on file  Occupational History   Not on file  Tobacco Use   Smoking status: Never   Smokeless tobacco: Never  Vaping Use   Vaping status: Never Used  Substance and Sexual Activity   Alcohol use: Yes    Alcohol/week: 3.0 standard drinks of alcohol    Types: 1 Glasses of wine, 1 Cans of beer, 1 Shots of liquor per week    Comment: Socially   Drug use: Never   Sexual activity: Not Currently  Other Topics Concern   Not on file  Social History Narrative   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 [employer]   Social Drivers of Health   Financial Resource Strain: Low Risk  (08/14/2020)   Overall Financial Resource Strain (CARDIA)    Difficulty of Paying Living Expenses: Not hard at all  Food Insecurity: No Food Insecurity (08/14/2020)   Hunger Vital Sign    Worried About Running Out of Food in the Last Year: Never true     Ran Out of Food in the Last Year: Never true  Transportation Needs: No Transportation Needs (08/14/2020)   PRAPARE - Administrator, Civil Service (Medical): No    Lack of Transportation (Non-Medical): No  Physical Activity: Sufficiently Active (08/14/2020)   Exercise Vital Sign    Days of Exercise per Week: 7 days    Minutes of Exercise per Session: 50 min  Stress: No Stress Concern Present (08/14/2020)   [employer] of Occupational Health - Occupational Stress Questionnaire    Feeling of Stress : Only a little  Social Connections: Moderately Isolated (08/14/2020)   Social Connection and Isolation Panel [NHANES]    Frequency of Communication with Friends and Family: More than three times a week    Frequency of Social Gatherings with Friends and Family: More than three times a week    Attends Religious Services: 1 to 4 times per year    Active Member of [employer] or Organizations: No    Attends Banker Meetings: Never    Marital Status: Never married  Intimate Partner Violence: Not At Risk (08/14/2020)   Humiliation, Afraid, Rape, and Kick questionnaire    Fear of Current or Ex-Partner: No    Emotionally Abused: No    Physically Abused: No    Sexually Abused: No   Social History   Tobacco Use  Smoking Status Never  Smokeless Tobacco Never   Social History   Substance and Sexual Activity  Alcohol Use Yes   Alcohol/week: 3.0 standard drinks of alcohol   Types: 1 Glasses of wine, 1 Cans of beer, 1 Shots of liquor per week   Comment: Socially    Family History  Problem Relation Age of Onset   Asthma Mother    Hyperlipidemia Father    Diabetes Maternal Aunt    Birth defects Paternal Grandfather    Colon cancer Neg Hx    Esophageal cancer Neg Hx    Rectal cancer Neg Hx    Stomach cancer Neg Hx    Colon polyps Neg Hx      ROS: Denies fever, fatigue, unexplained weight loss/gain, chest pain, SHOB, and palpitations. Denies neurological deficits,  gastrointestinal or genitourinary complaints, and skin changes.   Objective:   Today's Vitals   07/13/23 1356  BP: 126/79  Pulse: 70  SpO2: 99%  Weight: 149 lb 9.6 oz (67.9 kg)  Height: 5\' 4"  (1.626 m)    GENERAL APPEARANCE: Well-appearing, in NAD. Well  nourished.  SKIN: Pink, warm and dry. Turgor normal. No rash, ulceration, or ecchymoses.  Small irregularly shaped flesh colored lesion to right side of face under right eye with mildly irregular border. Hair evenly distributed.  HEENT: HEAD: Normocephalic.  EYES: PERRLA. EOMI. Lids intact w/o defect. Sclera white, Conjunctiva pink w/o exudate.  EARS: External ear w/o redness, swelling, masses or lesions. EAC clear. TM's intact, translucent w/o bulging, appropriate landmarks visualized. Appropriate acuity to conversational tones.  NOSE: Septum midline w/o deformity. Nares patent, mucosa pink and non-inflamed w/o drainage. No sinus tenderness.  THROAT: Uvula midline. Oropharynx clear. Tonsils non-inflamed w/o exudate. Oral mucosa pink and moist.  NECK: Supple, Trachea midline. Full ROM w/o pain or tenderness. No lymphadenopathy. Thyroid  non-tender w/o enlargement or palpable masses.  RESPIRATORY: Chest wall symmetrical w/o masses. Respirations even and non-labored. Breath sounds clear to auscultation bilaterally. No wheezes, rales, rhonchi, or crackles. CARDIAC: S1, S2 present, regular rate and rhythm. No gallops, murmurs, rubs, or clicks. PMI w/o lifts, heaves, or thrills. No carotid bruits. Capillary refill <2 seconds. Peripheral pulses 2+ bilaterally. GI: Abdomen soft w/o distention. Normoactive bowel sounds. No palpable masses or tenderness. No guarding or rebound tenderness. Liver and spleen w/o tenderness or enlargement. No CVA tenderness.  MSK: Muscle tone and strength appropriate for age, w/o atrophy or abnormal movement.  EXTREMITIES: Active ROM intact, w/o tenderness, crepitus, or contracture. No obvious joint deformities or  effusions. No clubbing, edema, or cyanosis.  NEUROLOGIC: CN's II-XII intact. Motor strength symmetrical with no obvious weakness. No sensory deficits. DTR's 2+ symmetric bilaterally. Steady, even gait.  PSYCH/MENTAL STATUS: Alert, oriented x 3. Cooperative, appropriate mood and affect.    Assessment & Plan:  1. Annual physical exam (Primary) Oona is doing well overall. 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 Reviewed her preventative exams and recommended TDAP. She will return when fasting to obtain her lab work. Reviewed healthy lifestyle and exercise.  - CBC with Differential/Platelet; Future - Comprehensive metabolic panel with GFR; Future - Lipid panel; Future - TSH; Future  2. Screening for lipid disorders - Lipid panel; Future  3. Pigmented skin lesion suspicious for malignant neoplasm Mild irregular shape and borders with dermascope. Recommend follow up with Dermatology. Patient agreeable and referral placed.  - Ambulatory referral to Dermatology   Orders Placed This Encounter  Procedures   CBC with Differential/Platelet    Standing Status:   Future    Expiration Date:   01/13/2024   Comprehensive metabolic panel with GFR    Standing Status:   Future    Expiration Date:   01/13/2024   Lipid panel    Standing Status:   Future    Expiration Date:   01/13/2024   TSH    Standing Status:   Future    Expiration Date:   01/13/2024   Ambulatory referral to Dermatology    Referral Priority:   Routine    Referral Type:   Consultation    Referral Reason:   Specialty Services Required    Requested Specialty:   Dermatology    Number of Visits Requested:   1    PATIENT COUNSELING:  - Encouraged a healthy well-balanced diet. Patient may adjust caloric intake to maintain or achieve ideal body weight. May reduce intake of dietary saturated fat and total fat and have adequate dietary potassium and calcium preferably from fresh fruits, vegetables, and low-fat dairy  products.   - Advised to avoid cigarette smoking. - Discussed with the patient that most people either  abstain from alcohol or drink within safe limits (<=14/week and <=4 drinks/occasion for males, <=7/weeks and <= 3 drinks/occasion for females) and that the risk for alcohol disorders and other health effects rises proportionally with the number of drinks per week and how often a drinker exceeds daily limits. - Discussed cessation/primary prevention of drug use and availability of treatment for abuse.  - Discussed sexually transmitted diseases, avoidance of unintended pregnancy and contraceptive alternatives.  - Stressed the importance of regular exercise - Injury prevention: Discussed safety belts, safety helmets, smoke detector, smoking near bedding or upholstery.  - Dental health: Discussed importance of regular tooth brushing, flossing, and dental visits.   NEXT PREVENTATIVE PHYSICAL DUE IN 1 YEAR.  Return in about 1 year (around 07/12/2024) for ANNUAL PHYSICAL.  Patient to reach out to office if new, worrisome, or unresolved symptoms arise or if no improvement in patient's condition. Patient verbalized understanding and is agreeable to treatment plan. All questions answered to patient's satisfaction.    Nonda Bays, [place]

## 2023-07-13 NOTE — Progress Notes (Signed)
 Crystal Lawson,   Your mammogram results show no evidence of breast cancer. We will plan to repeat this in 1 year for routine screening.  If you should have concerns or changes in your breasts within the next year, please let us  know.   Sylvester Evert, FNP-C

## 2023-07-14 ENCOUNTER — Other Ambulatory Visit (HOSPITAL_BASED_OUTPATIENT_CLINIC_OR_DEPARTMENT_OTHER): Payer: Self-pay | Admitting: *Deleted

## 2023-07-14 DIAGNOSIS — Z13220 Encounter for screening for lipoid disorders: Secondary | ICD-10-CM

## 2023-07-14 DIAGNOSIS — Z0000 Encounter for general adult medical examination without abnormal findings: Secondary | ICD-10-CM

## 2023-07-15 LAB — LIPID PANEL
Chol/HDL Ratio: 2.5 ratio (ref 0.0–4.4)
Cholesterol, Total: 170 mg/dL (ref 100–199)
HDL: 68 mg/dL (ref >39)
LDL Chol Calc (NIH): 87 mg/dL (ref 0–99)
Triglycerides: 81 mg/dL (ref 0–149)
VLDL Cholesterol Cal: 15 mg/dL (ref 5–40)

## 2023-07-15 LAB — COMPREHENSIVE METABOLIC PANEL WITH GFR
ALT: 16 IU/L (ref 0–32)
AST: 19 IU/L (ref 0–40)
Albumin: 4.5 g/dL (ref 3.9–4.9)
Alkaline Phosphatase: 67 IU/L (ref 44–121)
BUN/Creatinine Ratio: 12 (ref 9–23)
BUN: 10 mg/dL (ref 6–24)
Bilirubin Total: 0.3 mg/dL (ref 0.0–1.2)
CO2: 20 mmol/L (ref 20–29)
Calcium: 9.2 mg/dL (ref 8.7–10.2)
Chloride: 102 mmol/L (ref 96–106)
Creatinine, Ser: 0.85 mg/dL (ref 0.57–1.00)
Globulin, Total: 2.6 g/dL (ref 1.5–4.5)
Glucose: 91 mg/dL (ref 70–99)
Potassium: 4.6 mmol/L (ref 3.5–5.2)
Sodium: 139 mmol/L (ref 134–144)
Total Protein: 7.1 g/dL (ref 6.0–8.5)
eGFR: 86 mL/min/{1.73_m2} (ref >59)

## 2023-07-15 LAB — CBC WITH DIFFERENTIAL/PLATELET
Basophils Absolute: 0.1 10*3/uL (ref 0.0–0.2)
Basos: 1 %
EOS (ABSOLUTE): 0.1 10*3/uL (ref 0.0–0.4)
Eos: 2 %
Hematocrit: 41.3 % (ref 34.0–46.6)
Hemoglobin: 14.1 g/dL (ref 11.1–15.9)
Immature Grans (Abs): 0 10*3/uL (ref 0.0–0.1)
Immature Granulocytes: 0 %
Lymphocytes Absolute: 1.4 10*3/uL (ref 0.7–3.1)
Lymphs: 30 %
MCH: 32.6 pg (ref 26.6–33.0)
MCHC: 34.1 g/dL (ref 31.5–35.7)
MCV: 95 fL (ref 79–97)
Monocytes Absolute: 0.2 10*3/uL (ref 0.1–0.9)
Monocytes: 4 %
Neutrophils Absolute: 3.1 10*3/uL (ref 1.4–7.0)
Neutrophils: 63 %
Platelets: 305 10*3/uL (ref 150–450)
RBC: 4.33 x10E6/uL (ref 3.77–5.28)
RDW: 12.5 % (ref 11.7–15.4)
WBC: 4.8 10*3/uL (ref 3.4–10.8)

## 2023-07-15 LAB — TSH: TSH: 1.83 u[IU]/mL (ref 0.450–4.500)

## 2023-07-18 ENCOUNTER — Ambulatory Visit (HOSPITAL_BASED_OUTPATIENT_CLINIC_OR_DEPARTMENT_OTHER): Payer: Self-pay | Admitting: Family Medicine

## 2023-07-18 NOTE — Progress Notes (Signed)
 Hi Crystal Lawson,  Your thyroid  function, blood counts and electrolytes are normal. Your cholesterol is very well controlled. Your kidney and liver function is normal. We will plan to repeat your labs at your annual exam in 1 year.

## 2023-08-23 ENCOUNTER — Other Ambulatory Visit (HOSPITAL_COMMUNITY): Payer: Self-pay

## 2023-08-23 ENCOUNTER — Other Ambulatory Visit (HOSPITAL_BASED_OUTPATIENT_CLINIC_OR_DEPARTMENT_OTHER): Payer: Self-pay

## 2023-09-14 ENCOUNTER — Encounter: Payer: Self-pay | Admitting: Physician Assistant

## 2023-09-14 ENCOUNTER — Ambulatory Visit: Admitting: Physician Assistant

## 2023-09-14 VITALS — BP 107/69

## 2023-09-14 DIAGNOSIS — D1801 Hemangioma of skin and subcutaneous tissue: Secondary | ICD-10-CM | POA: Diagnosis not present

## 2023-09-14 DIAGNOSIS — L821 Other seborrheic keratosis: Secondary | ICD-10-CM

## 2023-09-14 DIAGNOSIS — L814 Other melanin hyperpigmentation: Secondary | ICD-10-CM

## 2023-09-14 DIAGNOSIS — Z1283 Encounter for screening for malignant neoplasm of skin: Secondary | ICD-10-CM | POA: Diagnosis not present

## 2023-09-14 DIAGNOSIS — L578 Other skin changes due to chronic exposure to nonionizing radiation: Secondary | ICD-10-CM

## 2023-09-14 DIAGNOSIS — W908XXA Exposure to other nonionizing radiation, initial encounter: Secondary | ICD-10-CM | POA: Diagnosis not present

## 2023-09-14 DIAGNOSIS — D229 Melanocytic nevi, unspecified: Secondary | ICD-10-CM

## 2023-09-14 NOTE — Progress Notes (Signed)
   New Patient Visit   Subjective  Crystal Lawson is a 48 y.o. female who presents for the following: Skin Cancer Screening and Full Body Skin Exam - No history of skin cancer. No family history of skin cancer. She has a small spot on her right cheek she would like checked.  The patient presents for Total-Body Skin Exam (TBSE) for skin cancer screening and mole check. The patient has spots, moles and lesions to be evaluated, some may be new or changing and the patient may have concern these could be cancer.    The following portions of the chart were reviewed this encounter and updated as appropriate: medications, allergies, medical history  Review of Systems:  No other skin or systemic complaints except as noted in HPI or Assessment and Plan.  Objective  Well appearing patient in no apparent distress; mood and affect are within normal limits.  A full examination was performed including scalp, head, eyes, ears, nose, lips, neck, chest, axillae, abdomen, back, buttocks, bilateral upper extremities, bilateral lower extremities, hands, feet, fingers, toes, fingernails, and toenails. All findings within normal limits unless otherwise noted below.   Relevant physical exam findings are noted in the Assessment and Plan.    Assessment & Plan   SKIN CANCER SCREENING PERFORMED TODAY.  ACTINIC DAMAGE - Chronic condition, secondary to cumulative UV/sun exposure - diffuse scaly erythematous macules with underlying dyspigmentation - Recommend daily broad spectrum sunscreen SPF 30+ to sun-exposed areas, reapply every 2 hours as needed.  - Staying in the shade or wearing long sleeves, sun glasses (UVA+UVB protection) and wide brim hats (4-inch brim around the entire circumference of the hat) are also recommended for sun protection.  - Call for new or changing lesions.  LENTIGINES, SEBORRHEIC KERATOSES, HEMANGIOMAS - Benign normal skin lesions - Benign-appearing - Call for any  changes  MELANOCYTIC NEVI - Tan-brown and/or pink-flesh-colored symmetric macules and papules - Benign appearing on exam today - Observation - Call clinic for new or changing moles - Recommend daily use of broad spectrum spf 30+ sunscreen to sun-exposed areas.        SCREENING EXAM FOR SKIN CANCER   ACTINIC SKIN DAMAGE   LENTIGINES   SEBORRHEIC KERATOSIS   CHERRY ANGIOMA   MULTIPLE BENIGN NEVI    Return if symptoms worsen or fail to improve.  I, Roseline Hutchinson, CMA, am acting as scribe for Laranda Burkemper K, PA-C .   Documentation: I have reviewed the above documentation for accuracy and completeness, and I agree with the above.  Jazzman Loughmiller K, PA-C

## 2023-09-14 NOTE — Patient Instructions (Signed)

## 2023-09-27 ENCOUNTER — Other Ambulatory Visit (HOSPITAL_BASED_OUTPATIENT_CLINIC_OR_DEPARTMENT_OTHER): Payer: Self-pay

## 2023-09-27 DIAGNOSIS — Z01419 Encounter for gynecological examination (general) (routine) without abnormal findings: Secondary | ICD-10-CM | POA: Diagnosis not present

## 2023-09-27 DIAGNOSIS — Z3009 Encounter for other general counseling and advice on contraception: Secondary | ICD-10-CM | POA: Diagnosis not present

## 2023-09-27 MED ORDER — NORETHIN ACE-ETH ESTRAD-FE 1-20 MG-MCG PO TABS
1.0000 | ORAL_TABLET | Freq: Every day | ORAL | 4 refills | Status: AC
  Filled 2023-09-27 – 2024-01-24 (×2): qty 84, 84d supply, fill #0

## 2023-11-14 ENCOUNTER — Other Ambulatory Visit (HOSPITAL_BASED_OUTPATIENT_CLINIC_OR_DEPARTMENT_OTHER): Payer: Self-pay

## 2023-11-14 MED ORDER — FLUZONE 0.5 ML IM SUSY
0.5000 mL | PREFILLED_SYRINGE | Freq: Once | INTRAMUSCULAR | 0 refills | Status: AC
  Filled 2023-11-14: qty 0.5, 1d supply, fill #0

## 2023-11-14 MED ORDER — COMIRNATY 30 MCG/0.3ML IM SUSY
0.3000 mL | PREFILLED_SYRINGE | Freq: Once | INTRAMUSCULAR | 0 refills | Status: AC
  Filled 2023-11-14: qty 0.3, 1d supply, fill #0

## 2023-12-12 ENCOUNTER — Other Ambulatory Visit (HOSPITAL_BASED_OUTPATIENT_CLINIC_OR_DEPARTMENT_OTHER): Payer: Self-pay

## 2024-01-24 ENCOUNTER — Other Ambulatory Visit (HOSPITAL_BASED_OUTPATIENT_CLINIC_OR_DEPARTMENT_OTHER): Payer: Self-pay

## 2024-01-25 ENCOUNTER — Other Ambulatory Visit (HOSPITAL_BASED_OUTPATIENT_CLINIC_OR_DEPARTMENT_OTHER): Payer: Self-pay

## 2024-07-18 ENCOUNTER — Encounter (HOSPITAL_BASED_OUTPATIENT_CLINIC_OR_DEPARTMENT_OTHER): Admitting: Family Medicine
